# Patient Record
Sex: Female | Born: 1970 | Race: Black or African American | Hispanic: No | Marital: Married | Smoking: Never smoker
Health system: Southern US, Community
[De-identification: ages and names within clinical notes are randomized; demographics above are authoritative.]

## PROBLEM LIST (undated history)

## (undated) DIAGNOSIS — K5792 Diverticulitis of intestine, part unspecified, without perforation or abscess without bleeding: Secondary | ICD-10-CM

## (undated) DIAGNOSIS — K219 Gastro-esophageal reflux disease without esophagitis: Secondary | ICD-10-CM

## (undated) DIAGNOSIS — R51 Headache: Secondary | ICD-10-CM

## (undated) DIAGNOSIS — I82409 Acute embolism and thrombosis of unspecified deep veins of unspecified lower extremity: Secondary | ICD-10-CM

## (undated) DIAGNOSIS — I10 Essential (primary) hypertension: Secondary | ICD-10-CM

## (undated) DIAGNOSIS — I2699 Other pulmonary embolism without acute cor pulmonale: Secondary | ICD-10-CM

## (undated) HISTORY — PX: CHOLECYSTECTOMY: SHX55

## (undated) HISTORY — PX: REDUCTION MAMMAPLASTY: SUR839

## (undated) HISTORY — PX: ABDOMINAL HYSTERECTOMY: SHX81

## (undated) HISTORY — PX: BREAST SURGERY: SHX581

---

## 2012-10-22 ENCOUNTER — Ambulatory Visit (HOSPITAL_COMMUNITY)
Admission: RE | Admit: 2012-10-22 | Discharge: 2012-10-22 | Disposition: A | Discharge status: Home / Self Care | Payer: BC Managed Care – PPO | Source: Ambulatory Visit | Attending: Internal Medicine | Admitting: Internal Medicine

## 2012-10-22 ENCOUNTER — Other Ambulatory Visit (HOSPITAL_COMMUNITY): Payer: Self-pay | Admitting: Internal Medicine

## 2012-10-22 DIAGNOSIS — R109 Unspecified abdominal pain: Secondary | ICD-10-CM

## 2012-10-22 DIAGNOSIS — R1032 Left lower quadrant pain: Secondary | ICD-10-CM | POA: Insufficient documentation

## 2012-10-22 HISTORY — DX: Essential (primary) hypertension: I10

## 2012-10-22 LAB — CBC WITH DIFFERENTIAL/PLATELET
Eosinophils Absolute: 0.2 10*3/uL (ref 0.0–0.7)
Eosinophils Relative: 2 % (ref 0–5)
HCT: 39.3 % (ref 36.0–46.0)
Lymphs Abs: 3.6 10*3/uL (ref 0.7–4.0)
MCH: 28.2 pg (ref 26.0–34.0)
MCV: 81.4 fL (ref 78.0–100.0)
Monocytes Absolute: 0.4 10*3/uL (ref 0.1–1.0)
Platelets: 286 10*3/uL (ref 150–400)
RBC: 4.83 MIL/uL (ref 3.87–5.11)
RDW: 13.3 % (ref 11.5–15.5)

## 2012-10-22 LAB — COMPREHENSIVE METABOLIC PANEL
ALT: 11 U/L (ref 0–35)
Calcium: 9.2 mg/dL (ref 8.4–10.5)
Creatinine, Ser: 0.74 mg/dL (ref 0.50–1.10)
GFR calc Af Amer: >90 mL/min (ref >90)
Glucose, Bld: 94 mg/dL (ref 70–99)
Sodium: 137 mEq/L (ref 135–145)
Total Protein: 7.5 g/dL (ref 6.0–8.3)

## 2012-10-22 MED ORDER — IOHEXOL 300 MG/ML  SOLN
100.0000 mL | Freq: Once | INTRAMUSCULAR | Status: AC | PRN
  Administered 2012-10-22: 100 mL via INTRAVENOUS

## 2012-10-29 ENCOUNTER — Inpatient Hospital Stay (HOSPITAL_COMMUNITY)
Admission: AD | Admit: 2012-10-29 | Discharge: 2012-11-05 | DRG: 189 | Disposition: A | Discharge status: Home / Self Care | Payer: BC Managed Care – PPO | Source: Ambulatory Visit | Attending: Internal Medicine | Admitting: Internal Medicine

## 2012-10-29 ENCOUNTER — Encounter (HOSPITAL_COMMUNITY): Payer: Self-pay

## 2012-10-29 ENCOUNTER — Encounter (HOSPITAL_COMMUNITY): Payer: Self-pay | Admitting: General Practice

## 2012-10-29 ENCOUNTER — Inpatient Hospital Stay (HOSPITAL_COMMUNITY): Payer: BC Managed Care – PPO

## 2012-10-29 DIAGNOSIS — K5732 Diverticulitis of large intestine without perforation or abscess without bleeding: Secondary | ICD-10-CM

## 2012-10-29 DIAGNOSIS — E669 Obesity, unspecified: Secondary | ICD-10-CM | POA: Diagnosis present

## 2012-10-29 DIAGNOSIS — R109 Unspecified abdominal pain: Secondary | ICD-10-CM | POA: Diagnosis present

## 2012-10-29 DIAGNOSIS — R112 Nausea with vomiting, unspecified: Secondary | ICD-10-CM

## 2012-10-29 DIAGNOSIS — I10 Essential (primary) hypertension: Secondary | ICD-10-CM | POA: Diagnosis present

## 2012-10-29 DIAGNOSIS — K5792 Diverticulitis of intestine, part unspecified, without perforation or abscess without bleeding: Secondary | ICD-10-CM

## 2012-10-29 DIAGNOSIS — K219 Gastro-esophageal reflux disease without esophagitis: Secondary | ICD-10-CM | POA: Diagnosis present

## 2012-10-29 DIAGNOSIS — Q438 Other specified congenital malformations of intestine: Secondary | ICD-10-CM

## 2012-10-29 DIAGNOSIS — K6389 Other specified diseases of intestine: Principal | ICD-10-CM | POA: Diagnosis present

## 2012-10-29 DIAGNOSIS — Z6841 Body Mass Index (BMI) 40.0 and over, adult: Secondary | ICD-10-CM

## 2012-10-29 HISTORY — DX: Morbid (severe) obesity due to excess calories: E66.01

## 2012-10-29 HISTORY — DX: Diverticulitis of intestine, part unspecified, without perforation or abscess without bleeding: K57.92

## 2012-10-29 HISTORY — DX: Headache: R51

## 2012-10-29 HISTORY — DX: Gastro-esophageal reflux disease without esophagitis: K21.9

## 2012-10-29 LAB — CBC WITH DIFFERENTIAL/PLATELET
Basophils Absolute: 0 10*3/uL (ref 0.0–0.1)
Eosinophils Relative: 3 % (ref 0–5)
HCT: 37.7 % (ref 36.0–46.0)
Hemoglobin: 12.9 g/dL (ref 12.0–15.0)
Lymphocytes Relative: 45 % (ref 12–46)
Lymphs Abs: 3.1 10*3/uL (ref 0.7–4.0)
MCV: 81.6 fL (ref 78.0–100.0)
Monocytes Absolute: 0.4 10*3/uL (ref 0.1–1.0)
Monocytes Relative: 5 % (ref 3–12)
Neutro Abs: 3.2 10*3/uL (ref 1.7–7.7)
RBC: 4.62 MIL/uL (ref 3.87–5.11)
RDW: 13.6 % (ref 11.5–15.5)
WBC: 6.9 10*3/uL (ref 4.0–10.5)

## 2012-10-29 LAB — HEMOGLOBIN A1C
Hgb A1c MFr Bld: 6.2 % — ABNORMAL HIGH (ref <5.7)
Mean Plasma Glucose: 131 mg/dL — ABNORMAL HIGH (ref <117)

## 2012-10-29 LAB — URINALYSIS, ROUTINE W REFLEX MICROSCOPIC
Hgb urine dipstick: NEGATIVE
Nitrite: NEGATIVE
Protein, ur: NEGATIVE mg/dL
Specific Gravity, Urine: 1.012 (ref 1.005–1.030)
Urobilinogen, UA: 0.2 mg/dL (ref 0.0–1.0)

## 2012-10-29 LAB — COMPREHENSIVE METABOLIC PANEL
ALT: 13 U/L (ref 0–35)
AST: 15 U/L (ref 0–37)
Albumin: 3.4 g/dL — ABNORMAL LOW (ref 3.5–5.2)
CO2: 28 mEq/L (ref 19–32)
Calcium: 9.5 mg/dL (ref 8.4–10.5)
Creatinine, Ser: 0.75 mg/dL (ref 0.50–1.10)
GFR calc non Af Amer: >90 mL/min (ref >90)
Sodium: 141 mEq/L (ref 135–145)

## 2012-10-29 LAB — GLUCOSE, CAPILLARY: Glucose-Capillary: 114 mg/dL — ABNORMAL HIGH (ref 70–99)

## 2012-10-29 LAB — PROTIME-INR: INR: 0.94 (ref 0.00–1.49)

## 2012-10-29 LAB — PHOSPHORUS: Phosphorus: 2.8 mg/dL (ref 2.3–4.6)

## 2012-10-29 MED ORDER — CIPROFLOXACIN IN D5W 400 MG/200ML IV SOLN
400.0000 mg | Freq: Two times a day (BID) | INTRAVENOUS | Status: DC
  Administered 2012-10-29 – 2012-10-30 (×2): 400 mg via INTRAVENOUS
  Filled 2012-10-29 (×3): qty 200

## 2012-10-29 MED ORDER — METRONIDAZOLE IN NACL 5-0.79 MG/ML-% IV SOLN
500.0000 mg | Freq: Three times a day (TID) | INTRAVENOUS | Status: DC
  Administered 2012-10-29 – 2012-10-30 (×3): 500 mg via INTRAVENOUS
  Filled 2012-10-29 (×5): qty 100

## 2012-10-29 MED ORDER — HYDROMORPHONE HCL PF 1 MG/ML IJ SOLN
1.0000 mg | INTRAMUSCULAR | Status: DC | PRN
  Administered 2012-10-29 – 2012-10-31 (×10): 1 mg via INTRAVENOUS
  Filled 2012-10-29 (×10): qty 1

## 2012-10-29 MED ORDER — HYDROMORPHONE HCL PF 1 MG/ML IJ SOLN
0.5000 mg | INTRAMUSCULAR | Status: DC | PRN
  Administered 2012-10-29 (×2): 0.5 mg via INTRAVENOUS
  Filled 2012-10-29 (×2): qty 1

## 2012-10-29 MED ORDER — ACETAMINOPHEN 650 MG RE SUPP: 650.0000 mg | Freq: Four times a day (QID) | RECTAL | Status: DC | PRN

## 2012-10-29 MED ORDER — SODIUM CHLORIDE 0.9 % IV SOLN
INTRAVENOUS | Status: DC
  Administered 2012-10-29 – 2012-11-03 (×9): via INTRAVENOUS

## 2012-10-29 MED ORDER — INFLUENZA VIRUS VACC SPLIT PF IM SUSP
0.5000 mL | INTRAMUSCULAR | Status: AC
  Administered 2012-10-31: 0.5 mL via INTRAMUSCULAR
  Filled 2012-10-29: qty 0.5

## 2012-10-29 MED ORDER — PROMETHAZINE HCL 25 MG PO TABS
12.5000 mg | ORAL_TABLET | Freq: Four times a day (QID) | ORAL | Status: DC | PRN
  Administered 2012-10-29 – 2012-10-30 (×2): 12.5 mg via ORAL
  Filled 2012-10-29 (×3): qty 1

## 2012-10-29 MED ORDER — OXYCODONE HCL 5 MG PO TABS
5.0000 mg | ORAL_TABLET | ORAL | Status: DC | PRN
  Administered 2012-10-29: 5 mg via ORAL
  Filled 2012-10-29: qty 1

## 2012-10-29 MED ORDER — IOHEXOL 300 MG/ML  SOLN
20.0000 mL | INTRAMUSCULAR | Status: AC
  Administered 2012-10-29 (×2): 20 mL via ORAL

## 2012-10-29 MED ORDER — ENOXAPARIN SODIUM 40 MG/0.4ML ~~LOC~~ SOLN
40.0000 mg | SUBCUTANEOUS | Status: DC
  Administered 2012-10-29: 40 mg via SUBCUTANEOUS
  Filled 2012-10-29 (×2): qty 0.4

## 2012-10-29 MED ORDER — IOHEXOL 300 MG/ML  SOLN
100.0000 mL | Freq: Once | INTRAMUSCULAR | Status: AC | PRN
  Administered 2012-10-29: 100 mL via INTRAVENOUS

## 2012-10-29 MED ORDER — ACETAMINOPHEN 325 MG PO TABS: 650.0000 mg | ORAL_TABLET | Freq: Four times a day (QID) | ORAL | Status: DC | PRN

## 2012-10-29 NOTE — H&P (Signed)
[facility] History and Physical  Jasmine Roberson RUE:[id number] DOB: 20-Oct-1971 DOA: 10/29/2012  Referring physician: Dr Lovell Sheehan PCP: Ron Parker, MD  Specialists: none  Chief Complaint: left lower quadrant abdominal pain since 3 weeks.   HPI: Jasmine Roberson is a 41 y.o. female with prior h/o hypertension, complains of 3 weeksof LLQ abdominal pain, associated with nausea and vomiting went to PCP , a CT  Of the abdomen was ordered which showed diverticulitis and she was sent home on oral antibiotics one week ago. Her symptoms havent improved on oral antibiotic therapy. Hence she is referred for admission for iv antibiotics.  Review of Systems: The patient denies anorexia, fever, weight loss,, vision loss, decreased hearing, hoarseness, chest pain, syncope, dyspnea on exertion, peripheral edema, balance deficits, hemoptysis, , melena, hematochezia, severe indigestion/heartburn, hematuria, incontinence, genital sores, muscle weakness, suspicious skin lesions, transient blindness, difficulty walking, depression, unusual weight change, abnormal bleeding, enlarged lymph nodes, angioedema, and breast masses.    Past Medical History  Diagnosis Date  . HTN (hypertension)    No past surgical history on file. Social History:  does not have a smoking history on file. She does not have any smokeless tobacco history on file. Her alcohol and drug histories not on file.  where does patient live--home,   Can patient participate in ADLs?yes  Allergies not on file  CAD, STROKE AND DM in mother and father. (be sure to complete)  Prior to Admission medications   Not on File   Physical Exam: Filed Vitals:   10/29/12 1215  BP: 160/108  Pulse: 67  Temp: 98.1 F (36.7 C)  TempSrc: Oral  Resp: 18  SpO2: 99%    Constitutional: Vital signs reviewed.  Patient is a well-developed and well-nourished  in no acute distress and cooperative with exam. Alert and oriented x3.  Head:  Normocephalic and atraumat Mouth: no erythema or exudates, MMM Eyes: PERRL, EOMI, conjunctivae normal, No scleral icterus.  Neck: Supple, Trachea midline normal ROM, No JVD, mass, thyromegaly, or carotid bruit present.  Cardiovascular: RRR, S1 normal, S2 normal, no MRG, pulses symmetric and intact bilaterally Pulmonary/Chest: CTAB, no wheezes, rales, or rhonchi Abdominal: Soft.-tender in the LLQ, non-distended, bowel sounds are normal, no masses, organomegaly, or guarding present.  Musculoskeletal: No joint deformities, erythema, or stiffness, ROM full and no nontender Hematology: no cervical, inginal, or axillary adenopathy.  Neurological: A&O x3, Strength is normal and symmetric bilaterally, cranial nerve II-XII are grossly intact, no focal motor deficit, sensory intact to light touch bilaterally.  Skin: Warm, dry and intact. No rash, cyanosis, or clubbing.  Psychiatric: Normal mood and affect. speech and behavior is normal. Judgment and thought content normal. Cognition and memory are normal.     Labs on Admission:  Basic Metabolic Panel:  Lab 10/22/12 1478  NA 137  K 3.7  CL 100  CO2 27  GLUCOSE 94  BUN 9  CREATININE 0.74  CALCIUM 9.2  MG --  PHOS --   Liver Function Tests:  Lab 10/22/12 1550  AST 13  ALT 11  ALKPHOS 95  BILITOT 0.4  PROT 7.5  ALBUMIN 3.6   No results found for this basename: LIPASE:5,AMYLASE:5 in the last 168 hours No results found for this basename: AMMONIA:5 in the last 168 hours CBC:  Lab 10/22/12 1550  WBC 8.1  NEUTROABS 3.9  HGB 13.6  HCT 39.3  MCV 81.4  PLT 286   Cardiac Enzymes: No results found for this basename: CKTOTAL:5,CKMB:5,CKMBINDEX:5,TROPONINI:5 in the last 168 hours  BNP (last 3 results) No results found for this basename: PROBNP:3 in the last 8760 hours CBG: No results found for this basename: GLUCAP:5 in the last 168 hours  Radiological Exams on Admission: No results found.  EKG:NSR  Assessment/Plan Active  Problems:  Diverticulitis  Nausea & vomiting  Hypertension  Abdominal pain   1. DIVERTICULITIS - admit to med surg - iv antibiotics - repeat CT abd and pelvis to evaluate for abscess - clear liq diet  2. Hypertension; well controlled.not on any medications currently.   DVT prophylaxis Code Status: full code. Family Communication: none at bedside Disposition Plan:  2 days  Time spent: 52 min  Jaidee Stipe [facility] Pager [phone]  If 7PM-7AM, please contact night-coverage [email or web address] Password [email or web address] 10/29/2012, 12:57 PM

## 2012-10-29 NOTE — Progress Notes (Signed)
PENDING ADMISSION:  Call received from:    Dr. Della Goo  REASON FOR REQUESTING ADMISSION: Acute diverticulitis  HPI:   41 year old female with history of hypertension, evaluated by her primary care physician for LLQ abdominal pain and nausea/vomiting. CT scan on 10/22/2012 showed sigmoid diverticulitis, patient treated with as outpatient with oral Cipro and Flagyl, but she continues to have nausea/vomiting and LLQ abdominal pain. Patient will be admitted for further evaluation, recommends repeat CT scan on arrival.   PLAN:  According to telephone report, this patient was accepted for direct admit,  Under [facility] team:  [id number],  I have requested an order be written to call Flow Manager at [phone] upon patient arrival to the floor for final physician assignment who will do the admission and give admitting orders.  SIGNED: Clint Lipps, MD [facility]  10/29/2012, 10:49 AM

## 2012-10-29 NOTE — Progress Notes (Signed)
ADMITTED TO ROOM 6N31. GAIT STEADY, A/OX4, C/O OF TENDERNESS LLQ, DENIES NAUSEA, ORIENTED TO ROOM AND SURROUNDINGS, VISITOR AT BEDSIDE, DR. Haywood Pao NOTIFIED OF ARRIVAL.

## 2012-10-30 LAB — GLUCOSE, CAPILLARY: Glucose-Capillary: 116 mg/dL — ABNORMAL HIGH (ref 70–99)

## 2012-10-30 MED ORDER — PROMETHAZINE HCL 25 MG/ML IJ SOLN
25.0000 mg | Freq: Four times a day (QID) | INTRAMUSCULAR | Status: DC | PRN
  Administered 2012-10-30 – 2012-10-31 (×5): 25 mg via INTRAVENOUS
  Administered 2012-10-31 (×2): 12.5 mg via INTRAVENOUS
  Administered 2012-11-01: 25 mg via INTRAVENOUS
  Filled 2012-10-30 (×8): qty 1

## 2012-10-30 MED ORDER — PIPERACILLIN-TAZOBACTAM 3.375 G IVPB: 3.3750 g | Freq: Three times a day (TID) | INTRAVENOUS | Status: DC

## 2012-10-30 MED ORDER — SODIUM CHLORIDE 0.9 % IV SOLN
500.0000 mg | Freq: Four times a day (QID) | INTRAVENOUS | Status: DC
  Administered 2012-10-30 – 2012-11-05 (×24): 500 mg via INTRAVENOUS
  Filled 2012-10-30 (×28): qty 500

## 2012-10-30 MED ORDER — ENOXAPARIN SODIUM 80 MG/0.8ML ~~LOC~~ SOLN
70.0000 mg | SUBCUTANEOUS | Status: DC
  Administered 2012-10-30 – 2012-11-04 (×6): 70 mg via SUBCUTANEOUS
  Filled 2012-10-30 (×7): qty 0.8

## 2012-10-30 NOTE — Progress Notes (Signed)
[facility] PROGRESS NOTE  Jasmine Roberson ZOX:[id number] DOB: 10-31-1971 DOA: 10/29/2012 PCP: Ron Parker, MD  Assessment/Plan: Active Problems:  Diverticulitis  Nausea & vomiting  Hypertension  Abdominal pain    1. Left lower quadrant pain, CT scan shows left lower quadrant acute epiploic appendagitis, will consult surgery, continue IV antibiotics at this time 2. Hypertension stable  Code Status: full Family Communication: family updated about patient's clinical progress Disposition Plan:  As above    Brief narrative: Jasmine Roberson is a 41 y.o. female with prior h/o hypertension, complains of 3 weeksof LLQ abdominal pain, associated with nausea and vomiting went to PCP , a CT Of the abdomen was ordered which showed diverticulitis and she was sent home on oral antibiotics one week ago. Her symptoms havent improved on oral antibiotic therapy. Hence she is referred for admission for iv antibiotics.   Consultants:  None  Procedures:  None*  Antibiotics:  Ciprofloxacin and Flagyl  HPI/Subjective: Still having some abdominal pain  Objective: Filed Vitals:   10/29/12 1321 10/29/12 1426 10/29/12 2135 10/30/12 0627  BP: 139/77  144/88 102/57  Pulse: 64  74 65  Temp: 97.6 F (36.4 C)  98.1 F (36.7 C) 98.2 F (36.8 C)  TempSrc: Oral  Oral Oral  Resp: 18  18 20   Height:  5\' 8"  (1.727 m)    Weight:  145.151 kg (320 lb)    SpO2: 100%  100% 98%    Intake/Output Summary (Last 24 hours) at 10/30/12 0924 Last data filed at 10/30/12 0500  Gross per 24 hour  Intake   1235 ml  Output      0 ml  Net   1235 ml    Exam:  HENT:  Head: Atraumatic.  Nose: Nose normal.  Mouth/Throat: Oropharynx is clear and moist.  Eyes: Conjunctivae are normal. Pupils are equal, round, and reactive to light. No scleral icterus.  Neck: Neck supple. No tracheal deviation present.  Cardiovascular: Normal rate, regular rhythm, normal heart sounds and intact distal  pulses.  Pulmonary/Chest: Effort normal and breath sounds normal. No respiratory distress.  Abdominal: Soft. Normal appearance and bowel sounds are normal. She exhibits no distension. There is no tenderness.  Musculoskeletal: She exhibits no edema and no tenderness.  Neurological: She is alert. No cranial nerve deficit.    Data Reviewed: Basic Metabolic Panel:  Lab 10/29/12 8119  NA 141  K 3.8  CL 107  CO2 28  GLUCOSE 95  BUN 7  CREATININE 0.75  CALCIUM 9.5  MG 2.0  PHOS 2.8    Liver Function Tests:  Lab 10/29/12 1401  AST 15  ALT 13  ALKPHOS 92  BILITOT 0.3  PROT 7.1  ALBUMIN 3.4*   No results found for this basename: LIPASE:5,AMYLASE:5 in the last 168 hours No results found for this basename: AMMONIA:5 in the last 168 hours  CBC:  Lab 10/29/12 1401  WBC 6.9  NEUTROABS 3.2  HGB 12.9  HCT 37.7  MCV 81.6  PLT 308    Cardiac Enzymes: No results found for this basename: CKTOTAL:5,CKMB:5,CKMBINDEX:5,TROPONINI:5 in the last 168 hours BNP (last 3 results) No results found for this basename: PROBNP:3 in the last 8760 hours   CBG:  Lab 10/30/12 0734 10/29/12 1332  GLUCAP 116* 114*    No results found for this or any previous visit (from the past 240 hour(s)).   Studies: Ct Abdomen Pelvis W Contrast  10/29/2012  *RADIOLOGY REPORT*  Clinical Data: Abdominal pain left lower quadrant pain, evaluate  for abscess  CT ABDOMEN AND PELVIS WITH CONTRAST  Technique:  Multidetector CT imaging of the abdomen and pelvis was performed following the standard protocol during bolus administration of intravenous contrast.  Contrast: OMNIPAQUE IOHEXOL 300 MG/ML  SOLN  Comparison: 10/22/2012  Findings: Lung bases clear.  Minimal atelectasis.  Normal heart size.  No pericardial or pleural effusion.  Abdomen:  Prior cholecystectomy noted.  Stable right hepatic 5 mm focal hypodensity, too small to definitively characterize but suspect small hepatic cyst, image 19.  No biliary  dilatation. Hepatic and portal veins are patent.  Biliary system, pancreas, spleen, adrenal glands, and kidneys demonstrate no acute finding and are within normal limits for age.  Negative for bowel obstruction, dilatation, ileus or free air.  No abdominal free fluid, fluid collection, hemorrhage, adenopathy, or abscess.  Pelvis:  Contrast opacifies the normal appendix.  Calcified right gonadal vein phlebolith noted, image 59 which is stable.  Terminal ileum is unremarkable.  In the left lower quadrant, there is improvement in the pericolonic focal inflammatory process centered in the pericolonic fat on image 69.  Adjacent colon does not demonstrated significant wall thickening or diverticular change.  The appearance compared to the prior study is more consistent with resolving acute epiploic appendagitis rather than diverticulitis.  No associate obstruction pattern or pelvic fluid collection.  Negative for abscess.  Prior hysterectomy noted.  No pelvic free fluid, fluid collection, hemorrhage, adenopathy, inguinal abnormality, hernia.  Urinary bladder unremarkable.  No acute abnormal osseous finding.  IMPRESSION: Resolving left lower quadrant acute epiploic appendagitis. Negative for abscess.   Original Report Authenticated By: Judie Petit. Ruel Favors, M.D.    Ct Abdomen Pelvis W Contrast  10/22/2012  *RADIOLOGY REPORT*  Clinical Data: Left lower quadrant pain.  Question diverticulitis?  CT ABDOMEN AND PELVIS WITH CONTRAST  Technique:  Multidetector CT imaging of the abdomen and pelvis was performed following the standard protocol during bolus administration of intravenous contrast.  Contrast: OMNIPAQUE IOHEXOL 300 MG/ML  SOLN  Comparison: None.  Findings: Extraluminal inflammation at the junction of the descending colon and sigmoid colon most consistent with changes of diverticulitis without drainable abscess or free intraperitoneal air.  Minimal basilar atelectasis.  Cardiomegaly.  No abdominal aortic aneurysm.   Post cholecystectomy.  Focal fatty infiltration within the liver adjacent the falciform ligament.  Right lobe of liver 5 mm low density lesion too small to be characterized as a simple cyst.  No focal splenic, pancreatic, renal or adrenal lesion.  Appearance of prior hysterectomy. Right ovary larger than the left containing small cysts/follicles.  Noncontrast filled views of the urinary bladder unremarkable.  No bony destructive lesion.  Scattered normal sized lymph nodes without adenopathy.  Mild diastasis rectus muscle without discrete bowel containing hernia.  IMPRESSION: Extraluminal inflammation at the junction of the descending colon and sigmoid colon most consistent with changes of diverticulitis without drainable abscess or free intraperitoneal air.  Please see above.  This has been made a PRA call report utilizing dashboard call feature.   Original Report Authenticated By: Fuller Canada, M.D.     Scheduled Meds:   . ciprofloxacin  400 mg Intravenous Q12H  . enoxaparin (LOVENOX) injection  40 mg Subcutaneous Q24H  . influenza  inactive virus vaccine  0.5 mL Intramuscular Tomorrow-1000  . iohexol  20 mL Oral Q1 Hr x 2  . metronidazole  500 mg Intravenous Q8H   Continuous Infusions:   . sodium chloride 50 mL/hr at 10/29/12 1356    Active  Problems:  Diverticulitis  Nausea & vomiting  Hypertension  Abdominal pain    Time spent: 40 minutes   [facility]  [facility] Pager [phone]. If 8PM-8AM, please contact night-coverage at [email or web address], password [facility] 10/30/2012, 9:24 AM  LOS: 1 day

## 2012-10-30 NOTE — Progress Notes (Signed)
ANTIBIOTIC CONSULT NOTE - INITIAL  Pharmacy Consult for Primaxin Indication: diverticulitis  Allergies  Allergen Reactions  . Morphine And Related   . Penicillins     Hives and lip swelling  . Zofran (Ondansetron Hcl)     Patient Measurements: Height: 5\' 8"  (172.7 cm) Weight: 320 lb (145.151 kg) IBW/kg (Calculated) : 63.9   Vital Signs: Temp: 98.2 F (36.8 C) (10/31 0627) Temp src: Oral (10/31 0627) BP: 102/57 mmHg (10/31 0627) Pulse Rate: 65  (10/31 0627) Intake/Output from previous day: 10/30 0701 - 10/31 0700 In: 1235 [P.O.:195; I.V.:740; IV Piggyback:300] Out: -  Intake/Output from this shift:    Labs:  Basename 10/29/12 1401  WBC 6.9  HGB 12.9  PLT 308  LABCREA --  CREATININE 0.75   Estimated Creatinine Clearance: 139.4 ml/min (by C-G formula based on Cr of 0.75). No results found for this basename: VANCOTROUGH:2,VANCOPEAK:2,VANCORANDOM:2,GENTTROUGH:2,GENTPEAK:2,GENTRANDOM:2,TOBRATROUGH:2,TOBRAPEAK:2,TOBRARND:2,AMIKACINPEAK:2,AMIKACINTROU:2,AMIKACIN:2, in the last 72 hours   Microbiology: No results found for this or any previous visit (from the past 720 hour(s)).  Medical History: Past Medical History  Diagnosis Date  . HTN (hypertension)   . Diverticulitis   . GERD (gastroesophageal reflux disease)   . Headache     Medications:  Prescriptions prior to admission  Medication Sig Dispense Refill  . amLODipine (NORVASC) 5 MG tablet Take 5 mg by mouth daily.      Marland Kitchen esomeprazole (NEXIUM) 40 MG capsule Take 40 mg by mouth daily before breakfast.      . traZODone (DESYREL) 50 MG tablet Take 50 mg by mouth at bedtime as needed.      . valsartan-hydrochlorothiazide (DIOVAN-HCT) 320-25 MG per tablet Take 1 tablet by mouth daily.       Assessment: Patient with a 3-wk hx of abd pain and CT evidence of diverticulitis did not respond to PO abx, and is admitted for IV antibiotics. Noted WBC is nml and pt is afebrile, and re-CT abd was negative for abscess  formation, but positive for general inflammation. Noted plans for surgical consult. Received orders to start Primaxin empirically (changed from Zosyn d/t PCN allx).  Goal of Therapy:  Limit broad spectrum abx course, treat infection  Plan:  - Primaxin 500mg  IV q 6h, start now - Considering patient has mild-moderate disease, if surgery assessment is benign, consider decreasing abx spectrum by using Cipro again or ertapenem. - Given BMI is 48kg/m2, will change DVT prophylaxis Lovenox dose to 70mg  q 24h. - Will f/up along with you daily.  Thanks, Jaran Sainz K. Allena Katz, PharmD, BCPS.  Clinical Pharmacist Pager [phone]. 10/30/2012 10:50 AM

## 2012-10-31 ENCOUNTER — Encounter (HOSPITAL_COMMUNITY): Payer: Self-pay | Admitting: General Surgery

## 2012-10-31 DIAGNOSIS — Q438 Other specified congenital malformations of intestine: Secondary | ICD-10-CM

## 2012-10-31 LAB — CBC
MCH: 27.7 pg (ref 26.0–34.0)
MCHC: 33.2 g/dL (ref 30.0–36.0)
MCV: 83.3 fL (ref 78.0–100.0)
Platelets: 284 10*3/uL (ref 150–400)
RDW: 13.6 % (ref 11.5–15.5)

## 2012-10-31 LAB — GLUCOSE, CAPILLARY: Glucose-Capillary: 105 mg/dL — ABNORMAL HIGH (ref 70–99)

## 2012-10-31 MED ORDER — HYDROMORPHONE HCL PF 1 MG/ML IJ SOLN
INTRAMUSCULAR | Status: AC
  Filled 2012-10-31: qty 1

## 2012-10-31 MED ORDER — KETOROLAC TROMETHAMINE 30 MG/ML IJ SOLN
30.0000 mg | Freq: Three times a day (TID) | INTRAMUSCULAR | Status: DC
  Administered 2012-10-31 – 2012-11-01 (×2): 30 mg via INTRAVENOUS
  Filled 2012-10-31 (×6): qty 1

## 2012-10-31 MED ORDER — HYDROMORPHONE HCL PF 1 MG/ML IJ SOLN
2.0000 mg | INTRAMUSCULAR | Status: DC | PRN
  Administered 2012-10-31: 2 mg via INTRAVENOUS
  Administered 2012-10-31: 1 mg via INTRAVENOUS
  Administered 2012-10-31 – 2012-11-01 (×5): 2 mg via INTRAVENOUS
  Administered 2012-11-01: 1 mg via INTRAVENOUS
  Administered 2012-11-01: 2 mg via INTRAVENOUS
  Administered 2012-11-01 (×3): 1 mg via INTRAVENOUS
  Administered 2012-11-01 – 2012-11-02 (×4): 2 mg via INTRAVENOUS
  Filled 2012-10-31 (×3): qty 2
  Filled 2012-10-31: qty 1
  Filled 2012-10-31 (×2): qty 2
  Filled 2012-10-31: qty 1
  Filled 2012-10-31: qty 2
  Filled 2012-10-31: qty 1
  Filled 2012-10-31 (×5): qty 2
  Filled 2012-10-31: qty 1

## 2012-10-31 NOTE — Consult Note (Signed)
Agree with PA-Osborne's consult epicloic appendigitis per CT scan Pt with no infection count.  Would recommend at minimum iv abx and npo. No surgical interventions needed. Will follow abd exams

## 2012-10-31 NOTE — Progress Notes (Signed)
ANTIBIOTIC CONSULT NOTE - FOLLOW UP  Pharmacy Consult for Primaxin Indication: diverticulitis  Allergies  Allergen Reactions  . Morphine And Related   . Penicillins     Hives and lip swelling  . Zofran (Ondansetron Hcl)     Patient Measurements: Height: 5\' 8"  (172.7 cm) Weight: 320 lb (145.151 kg) IBW/kg (Calculated) : 63.9   Vital Signs: Temp: 98.6 F (37 C) (11/01 0518) Temp src: Oral (11/01 0518) BP: 140/86 mmHg (11/01 0518) Pulse Rate: 75  (11/01 0518) Intake/Output from previous day: 10/31 0701 - 11/01 0700 In: 3122 [P.O.:360; I.V.:2662; IV Piggyback:100] Out: 1300 [Urine:1300] Intake/Output from this shift:    Labs:  Basename 10/29/12 1401  WBC 6.9  HGB 12.9  PLT 308  LABCREA --  CREATININE 0.75   Estimated Creatinine Clearance: 139.4 ml/min (by C-G formula based on Cr of 0.75). No results found for this basename: VANCOTROUGH:2,VANCOPEAK:2,VANCORANDOM:2,GENTTROUGH:2,GENTPEAK:2,GENTRANDOM:2,TOBRATROUGH:2,TOBRAPEAK:2,TOBRARND:2,AMIKACINPEAK:2,AMIKACINTROU:2,AMIKACIN:2, in the last 72 hours   Microbiology: No results found for this or any previous visit (from the past 720 hour(s)).  Anti-infectives     Start     Dose/Rate Route Frequency Ordered Stop   10/30/12 1100   imipenem-cilastatin (PRIMAXIN) 500 mg in sodium chloride 0.9 % 100 mL IVPB        500 mg 200 mL/hr over 30 Minutes Intravenous 4 times per day 10/30/12 1036     10/30/12 1030   piperacillin-tazobactam (ZOSYN) IVPB 3.375 g  Status:  Discontinued        3.375 g 12.5 mL/hr over 240 Minutes Intravenous 3 times per day 10/30/12 1018 10/30/12 1032   10/29/12 1500   ciprofloxacin (CIPRO) IVPB 400 mg  Status:  Discontinued        400 mg 200 mL/hr over 60 Minutes Intravenous Every 12 hours 10/29/12 1445 10/30/12 1017   10/29/12 1500   metroNIDAZOLE (FLAGYL) IVPB 500 mg  Status:  Discontinued        500 mg 100 mL/hr over 60 Minutes Intravenous Every 8 hours 10/29/12 1445 10/30/12 1017           Assessment: Diverticulitis:  On Day #2 of empiric therapy with Primaxin, broadened 10/31 from Cipro/Flagyl.  She remains afebrile.    Goal of Therapy:  Eradication of infection  Plan:  Continue Primaxin 500mg  IV q6h Monitor renal function and clinical condition.  Estella Husk, Pharm.D., BCPS Clinical Pharmacist  Phone [phone] Pager [phone] 10/31/2012, 12:32 PM

## 2012-10-31 NOTE — Consult Note (Signed)
Jasmine Roberson [birth year]  [id number].   Primary Care MD: Dr. Della Goo Requesting MD: Dr. Susie Cassette Chief Complaint/Reason for Consult: epiploic appendagitis HPI: This is a 41 yo female who developed periumbilical abdominal pain around 3 weeks again.  It began radiating to her LLQ.  She initially had no nausea or vomiting.  She does admit to some diarrhea.  She was evaluated and sent for a CT scan that questioned diverticulitis.  She was started as an outpatient on Cipro and Flagyl.  She developed nausea and vomiting secondary to her antibiotics.  She went back and saw her PCP who direct admitted her for further care.  She denies any fevers or chills.  She has had a CT scan during this admission which reveals epiploic appendagitis with improved stranding noted compared to prior CT scan.  Due to continued pain, we have been asked to see for further recommendations.  Review of Systems: Please see HPI otherwise all other systems have been reviewed and are negative.  History reviewed. No pertinent family history.  Past Medical History  Diagnosis Date  . HTN (hypertension)   . Diverticulitis   . GERD (gastroesophageal reflux disease)   . Headache     Past Surgical History  Procedure Date  . Cesarean section   . Cholecystectomy   . Abdominal hysterectomy     partial  . Breast surgery     reduction    Social History:  reports that she has never smoked. She has never used smokeless tobacco. She reports that she does not drink alcohol or use illicit drugs.  Allergies:  Allergies  Allergen Reactions  . Morphine And Related   . Penicillins     Hives and lip swelling  . Zofran (Ondansetron Hcl)     Medications Prior to Admission  Medication Sig Dispense Refill  . amLODipine (NORVASC) 5 MG tablet Take 5 mg by mouth daily.      Marland Kitchen esomeprazole (NEXIUM) 40 MG capsule Take 40 mg by mouth daily before breakfast.      . traZODone (DESYREL) 50 MG tablet Take 50 mg by mouth at  bedtime as needed.      . valsartan-hydrochlorothiazide (DIOVAN-HCT) 320-25 MG per tablet Take 1 tablet by mouth daily.        Blood pressure 140/86, pulse 75, temperature 98.6 F (37 C), temperature source Oral, resp. rate 18, height 5\' 8"  (1.727 m), weight 320 lb (145.151 kg), SpO2 100.00%. Physical Exam: General: pleasant, obese black female who is laying in bed in NAD HEENT: head is normocephalic, atraumatic.  Sclera are noninjected.  PERRL.  Ears and nose without any masses or lesions.  Mouth is pink and moist Heart: regular, rate, and rhythm.  Normal s1,s2. No obvious murmurs, gallops, or rubs noted.  Palpable radial and pedal pulses bilaterally Lungs: CTAB, no wheezes, rhonchi, or rales noted.  Respiratory effort nonlabored Abd: soft, tender on the left side of abdomen, ND, +BS, obese, no guarding, rebounding, or peritoneal signs, no masses, hernias, or organomegaly MS: all 4 extremities are symmetrical with no cyanosis, clubbing, or edema. Skin: warm and dry with no masses, lesions, or rashes Psych: A&Ox3 with an appropriate affect.    Results for orders placed during the hospital encounter of 10/29/12 (from the past 48 hour(s))  GLUCOSE, CAPILLARY     Status: Abnormal   Collection Time   10/29/12  1:32 PM      Component Value Range Comment   Glucose-Capillary 114 (*) 70 - 99 mg/dL  COMPREHENSIVE METABOLIC PANEL     Status: Abnormal   Collection Time   10/29/12  2:01 PM      Component Value Range Comment   Sodium 141  135 - 145 mEq/L    Potassium 3.8  3.5 - 5.1 mEq/L    Chloride 107  96 - 112 mEq/L    CO2 28  19 - 32 mEq/L    Glucose, Bld 95  70 - 99 mg/dL    BUN 7  6 - 23 mg/dL    Creatinine, Ser 4.54  0.50 - 1.10 mg/dL    Calcium 9.5  8.4 - 09.8 mg/dL    Total Protein 7.1  6.0 - 8.3 g/dL    Albumin 3.4 (*) 3.5 - 5.2 g/dL    AST 15  0 - 37 U/L    ALT 13  0 - 35 U/L    Alkaline Phosphatase 92  39 - 117 U/L    Total Bilirubin 0.3  0.3 - 1.2 mg/dL    GFR calc non Af  Amer >90  >90 mL/min    GFR calc Af Amer >90  >90 mL/min   MAGNESIUM     Status: Normal   Collection Time   10/29/12  2:01 PM      Component Value Range Comment   Magnesium 2.0  1.5 - 2.5 mg/dL   PHOSPHORUS     Status: Normal   Collection Time   10/29/12  2:01 PM      Component Value Range Comment   Phosphorus 2.8  2.3 - 4.6 mg/dL   CBC WITH DIFFERENTIAL     Status: Normal   Collection Time   10/29/12  2:01 PM      Component Value Range Comment   WBC 6.9  4.0 - 10.5 K/uL    RBC 4.62  3.87 - 5.11 MIL/uL    Hemoglobin 12.9  12.0 - 15.0 g/dL    HCT 11.9  14.7 - 82.9 %    MCV 81.6  78.0 - 100.0 fL    MCH 27.9  26.0 - 34.0 pg    MCHC 34.2  30.0 - 36.0 g/dL    RDW 56.2  13.0 - 86.5 %    Platelets 308  150 - 400 K/uL    Neutrophils Relative 47  43 - 77 %    Neutro Abs 3.2  1.7 - 7.7 K/uL    Lymphocytes Relative 45  12 - 46 %    Lymphs Abs 3.1  0.7 - 4.0 K/uL    Monocytes Relative 5  3 - 12 %    Monocytes Absolute 0.4  0.1 - 1.0 K/uL    Eosinophils Relative 3  0 - 5 %    Eosinophils Absolute 0.2  0.0 - 0.7 K/uL    Basophils Relative 0  0 - 1 %    Basophils Absolute 0.0  0.0 - 0.1 K/uL   TSH     Status: Normal   Collection Time   10/29/12  2:01 PM      Component Value Range Comment   TSH 3.833  0.350 - 4.500 uIU/mL   PROTIME-INR     Status: Normal   Collection Time   10/29/12  2:01 PM      Component Value Range Comment   Prothrombin Time 12.5  11.6 - 15.2 seconds    INR 0.94  0.00 - 1.49   HEMOGLOBIN A1C     Status: Abnormal   Collection Time   10/29/12  2:01 PM      Component Value Range Comment   Hemoglobin A1C 6.2 (*) <5.7 %    Mean Plasma Glucose 131 (*) <117 mg/dL   URINALYSIS, ROUTINE W REFLEX MICROSCOPIC     Status: Normal   Collection Time   10/29/12  3:14 PM      Component Value Range Comment   Color, Urine YELLOW  YELLOW    APPearance CLEAR  CLEAR    Specific Gravity, Urine 1.012  1.005 - 1.030    pH 7.5  5.0 - 8.0    Glucose, UA NEGATIVE  NEGATIVE mg/dL      Hgb urine dipstick NEGATIVE  NEGATIVE    Bilirubin Urine NEGATIVE  NEGATIVE    Ketones, ur NEGATIVE  NEGATIVE mg/dL    Protein, ur NEGATIVE  NEGATIVE mg/dL    Urobilinogen, UA 0.2  0.0 - 1.0 mg/dL    Nitrite NEGATIVE  NEGATIVE    Leukocytes, UA NEGATIVE  NEGATIVE MICROSCOPIC NOT DONE ON URINES WITH NEGATIVE PROTEIN, BLOOD, LEUKOCYTES, NITRITE, OR GLUCOSE <1000 mg/dL.  GLUCOSE, CAPILLARY     Status: Abnormal   Collection Time   10/30/12  7:34 AM      Component Value Range Comment   Glucose-Capillary 116 (*) 70 - 99 mg/dL   GLUCOSE, CAPILLARY     Status: Abnormal   Collection Time   10/31/12  8:22 AM      Component Value Range Comment   Glucose-Capillary 105 (*) 70 - 99 mg/dL    Comment 1 Documented in Chart      Comment 2 Notify RN      Ct Abdomen Pelvis W Contrast  10/29/2012  *RADIOLOGY REPORT*  Clinical Data: Abdominal pain left lower quadrant pain, evaluate for abscess  CT ABDOMEN AND PELVIS WITH CONTRAST  Technique:  Multidetector CT imaging of the abdomen and pelvis was performed following the standard protocol during bolus administration of intravenous contrast.  Contrast: OMNIPAQUE IOHEXOL 300 MG/ML  SOLN  Comparison: 10/22/2012  Findings: Lung bases clear.  Minimal atelectasis.  Normal heart size.  No pericardial or pleural effusion.  Abdomen:  Prior cholecystectomy noted.  Stable right hepatic 5 mm focal hypodensity, too small to definitively characterize but suspect small hepatic cyst, image 19.  No biliary dilatation. Hepatic and portal veins are patent.  Biliary system, pancreas, spleen, adrenal glands, and kidneys demonstrate no acute finding and are within normal limits for age.  Negative for bowel obstruction, dilatation, ileus or free air.  No abdominal free fluid, fluid collection, hemorrhage, adenopathy, or abscess.  Pelvis:  Contrast opacifies the normal appendix.  Calcified right gonadal vein phlebolith noted, image 59 which is stable.  Terminal ileum is  unremarkable.  In the left lower quadrant, there is improvement in the pericolonic focal inflammatory process centered in the pericolonic fat on image 69.  Adjacent colon does not demonstrated significant wall thickening or diverticular change.  The appearance compared to the prior study is more consistent with resolving acute epiploic appendagitis rather than diverticulitis.  No associate obstruction pattern or pelvic fluid collection.  Negative for abscess.  Prior hysterectomy noted.  No pelvic free fluid, fluid collection, hemorrhage, adenopathy, inguinal abnormality, hernia.  Urinary bladder unremarkable.  No acute abnormal osseous finding.  IMPRESSION: Resolving left lower quadrant acute epiploic appendagitis. Negative for abscess.   Original Report Authenticated By: Judie Petit. Ruel Favors, M.D.        Assessment/Plan 1. Epiploic appendagitis 2. HTN  Plan: 1. Treatment for this condition is  mostly symptomatic.  No antibiotics are indicated.  This is usually self-limiting.  Treatment with anti-inflammatories and narcotics as needed to control pain are appropriate.  No surgical indications.  The CT scan does show improvement in the amount of stranding the patient has, so it appears that the inflammation is improving.  Would recommend advancing diet as able and symptomatic treatment.  Thank you for this consultation.  Christofer Shen E 10/31/2012, 9:50 AM Pager: [phone]

## 2012-10-31 NOTE — Progress Notes (Signed)
[facility] PROGRESS NOTE  Jasmine Roberson ZOX:[id number] DOB: 1971/05/30 DOA: 10/29/2012 PCP: Ron Parker, MD  Assessment/Plan: Active Problems:  Diverticulitis  Nausea & vomiting  Hypertension  Abdominal pain  1. Left lower quadrant pain, CT scan shows left lower quadrant acute epiploic appendagitis, will consult surgery given unrelenting pain,, continue IV antibiotics at this time 2. Hypertension stable Code Status: full  Family Communication: family updated about patient's clinical progress  Disposition Plan: As above  Brief narrative:  Jasmine Roberson is a 41 y.o. female with prior h/o hypertension, complains of 3 weeksof LLQ abdominal pain, associated with nausea and vomiting went to PCP , a CT Of the abdomen was ordered which showed diverticulitis and she was sent home on oral antibiotics one week ago. Her symptoms havent improved on oral antibiotic therapy. Hence she is referred for admission for iv antibiotics.  Consultants:  None Procedures:  None* Antibiotics:  Ciprofloxacin and Flagyl HPI/Subjective:  Still complaining of a lot of abdominal pain  Objective: Filed Vitals:   10/30/12 0627 10/30/12 1405 10/30/12 2125 10/31/12 0518  BP: 102/57 102/57 133/68 140/86  Pulse: 65 66 84 75  Temp: 98.2 F (36.8 C) 98 F (36.7 C) 98.2 F (36.8 C) 98.6 F (37 C)  TempSrc: Oral Oral Oral Oral  Resp: 20 20 18 18   Height:      Weight:      SpO2: 98% 99% 100% 100%    Intake/Output Summary (Last 24 hours) at 10/31/12 0940 Last data filed at 10/31/12 0512  Gross per 24 hour  Intake   2882 ml  Output   1300 ml  Net   1582 ml    Exam:  HENT:  Head: Atraumatic.  Nose: Nose normal.  Mouth/Throat: Oropharynx is clear and moist.  Eyes: Conjunctivae are normal. Pupils are equal, round, and reactive to light. No scleral icterus.  Neck: Neck supple. No tracheal deviation present.  Cardiovascular: Normal rate, regular rhythm, normal heart sounds and  intact distal pulses.  Pulmonary/Chest: Effort normal and breath sounds normal. No respiratory distress.  Abdominal: Soft. Normal appearance and bowel sounds are normal. She exhibits no distension. There is no tenderness.  Musculoskeletal: She exhibits no edema and no tenderness.  Neurological: She is alert. No cranial nerve deficit.    Data Reviewed: Basic Metabolic Panel:  Lab 10/29/12 8119  NA 141  K 3.8  CL 107  CO2 28  GLUCOSE 95  BUN 7  CREATININE 0.75  CALCIUM 9.5  MG 2.0  PHOS 2.8    Liver Function Tests:  Lab 10/29/12 1401  AST 15  ALT 13  ALKPHOS 92  BILITOT 0.3  PROT 7.1  ALBUMIN 3.4*   No results found for this basename: LIPASE:5,AMYLASE:5 in the last 168 hours No results found for this basename: AMMONIA:5 in the last 168 hours  CBC:  Lab 10/29/12 1401  WBC 6.9  NEUTROABS 3.2  HGB 12.9  HCT 37.7  MCV 81.6  PLT 308    Cardiac Enzymes: No results found for this basename: CKTOTAL:5,CKMB:5,CKMBINDEX:5,TROPONINI:5 in the last 168 hours BNP (last 3 results) No results found for this basename: PROBNP:3 in the last 8760 hours   CBG:  Lab 10/31/12 0822 10/30/12 0734 10/29/12 1332  GLUCAP 105* 116* 114*    No results found for this or any previous visit (from the past 240 hour(s)).   Studies: Ct Abdomen Pelvis W Contrast  10/29/2012  *RADIOLOGY REPORT*  Clinical Data: Abdominal pain left lower quadrant pain, evaluate for abscess  CT ABDOMEN AND PELVIS WITH CONTRAST  Technique:  Multidetector CT imaging of the abdomen and pelvis was performed following the standard protocol during bolus administration of intravenous contrast.  Contrast: OMNIPAQUE IOHEXOL 300 MG/ML  SOLN  Comparison: 10/22/2012  Findings: Lung bases clear.  Minimal atelectasis.  Normal heart size.  No pericardial or pleural effusion.  Abdomen:  Prior cholecystectomy noted.  Stable right hepatic 5 mm focal hypodensity, too small to definitively characterize but suspect small  hepatic cyst, image 19.  No biliary dilatation. Hepatic and portal veins are patent.  Biliary system, pancreas, spleen, adrenal glands, and kidneys demonstrate no acute finding and are within normal limits for age.  Negative for bowel obstruction, dilatation, ileus or free air.  No abdominal free fluid, fluid collection, hemorrhage, adenopathy, or abscess.  Pelvis:  Contrast opacifies the normal appendix.  Calcified right gonadal vein phlebolith noted, image 59 which is stable.  Terminal ileum is unremarkable.  In the left lower quadrant, there is improvement in the pericolonic focal inflammatory process centered in the pericolonic fat on image 69.  Adjacent colon does not demonstrated significant wall thickening or diverticular change.  The appearance compared to the prior study is more consistent with resolving acute epiploic appendagitis rather than diverticulitis.  No associate obstruction pattern or pelvic fluid collection.  Negative for abscess.  Prior hysterectomy noted.  No pelvic free fluid, fluid collection, hemorrhage, adenopathy, inguinal abnormality, hernia.  Urinary bladder unremarkable.  No acute abnormal osseous finding.  IMPRESSION: Resolving left lower quadrant acute epiploic appendagitis. Negative for abscess.   Original Report Authenticated By: Judie Petit. Ruel Favors, M.D.    Ct Abdomen Pelvis W Contrast  10/22/2012  *RADIOLOGY REPORT*  Clinical Data: Left lower quadrant pain.  Question diverticulitis?  CT ABDOMEN AND PELVIS WITH CONTRAST  Technique:  Multidetector CT imaging of the abdomen and pelvis was performed following the standard protocol during bolus administration of intravenous contrast.  Contrast: OMNIPAQUE IOHEXOL 300 MG/ML  SOLN  Comparison: None.  Findings: Extraluminal inflammation at the junction of the descending colon and sigmoid colon most consistent with changes of diverticulitis without drainable abscess or free intraperitoneal air.  Minimal basilar atelectasis.   Cardiomegaly.  No abdominal aortic aneurysm.  Post cholecystectomy.  Focal fatty infiltration within the liver adjacent the falciform ligament.  Right lobe of liver 5 mm low density lesion too small to be characterized as a simple cyst.  No focal splenic, pancreatic, renal or adrenal lesion.  Appearance of prior hysterectomy. Right ovary larger than the left containing small cysts/follicles.  Noncontrast filled views of the urinary bladder unremarkable.  No bony destructive lesion.  Scattered normal sized lymph nodes without adenopathy.  Mild diastasis rectus muscle without discrete bowel containing hernia.  IMPRESSION: Extraluminal inflammation at the junction of the descending colon and sigmoid colon most consistent with changes of diverticulitis without drainable abscess or free intraperitoneal air.  Please see above.  This has been made a PRA call report utilizing dashboard call feature.   Original Report Authenticated By: Fuller Canada, M.D.     Scheduled Meds:   . enoxaparin (LOVENOX) injection  70 mg Subcutaneous Q24H  . HYDROmorphone      . imipenem-cilastatin  500 mg Intravenous Q6H  . influenza  inactive virus vaccine  0.5 mL Intramuscular Tomorrow-1000  . DISCONTD: ciprofloxacin  400 mg Intravenous Q12H  . DISCONTD: enoxaparin (LOVENOX) injection  40 mg Subcutaneous Q24H  . DISCONTD: metronidazole  500 mg Intravenous Q8H  . DISCONTD: piperacillin-tazobactam (  ZOSYN)  IV  3.375 g Intravenous Q8H   Continuous Infusions:   . sodium chloride 125 mL/hr at 10/31/12 4782    Active Problems:  Diverticulitis  Nausea & vomiting  Hypertension  Abdominal pain    Time spent: 40 minutes   [facility]  [facility] Pager [phone]. If 8PM-8AM, please contact night-coverage at [email or web address], password [facility] 10/31/2012, 9:40 AM  LOS: 2 days

## 2012-11-01 ENCOUNTER — Encounter (HOSPITAL_COMMUNITY): Payer: Self-pay | Admitting: Surgery

## 2012-11-01 DIAGNOSIS — E66813 Obesity, class 3: Secondary | ICD-10-CM

## 2012-11-01 DIAGNOSIS — K6389 Other specified diseases of intestine: Secondary | ICD-10-CM | POA: Diagnosis present

## 2012-11-01 HISTORY — DX: Morbid (severe) obesity due to excess calories: E66.01

## 2012-11-01 HISTORY — DX: Obesity, class 3: E66.813

## 2012-11-01 LAB — CBC
MCHC: 32.4 g/dL (ref 30.0–36.0)
Platelets: 299 10*3/uL (ref 150–400)
RDW: 13.8 % (ref 11.5–15.5)
WBC: 6.9 10*3/uL (ref 4.0–10.5)

## 2012-11-01 LAB — COMPREHENSIVE METABOLIC PANEL
AST: 20 U/L (ref 0–37)
Albumin: 3 g/dL — ABNORMAL LOW (ref 3.5–5.2)
Alkaline Phosphatase: 73 U/L (ref 39–117)
Chloride: 108 mEq/L (ref 96–112)
Creatinine, Ser: 0.85 mg/dL (ref 0.50–1.10)
Potassium: 3.6 mEq/L (ref 3.5–5.1)
Total Bilirubin: 0.4 mg/dL (ref 0.3–1.2)
Total Protein: 6.5 g/dL (ref 6.0–8.3)

## 2012-11-01 LAB — GLUCOSE, CAPILLARY

## 2012-11-01 MED ORDER — PSYLLIUM 95 % PO PACK
1.0000 | PACK | Freq: Two times a day (BID) | ORAL | Status: DC
  Administered 2012-11-01 – 2012-11-02 (×4): 1 via ORAL
  Filled 2012-11-01 (×10): qty 1

## 2012-11-01 MED ORDER — BLISTEX EX OINT
TOPICAL_OINTMENT | Freq: Two times a day (BID) | CUTANEOUS | Status: DC
  Administered 2012-11-01 – 2012-11-05 (×6): via TOPICAL
  Filled 2012-11-01: qty 10

## 2012-11-01 MED ORDER — NAPROXEN 500 MG PO TABS
500.0000 mg | ORAL_TABLET | Freq: Three times a day (TID) | ORAL | Status: DC
  Administered 2012-11-01 – 2012-11-02 (×4): 500 mg via ORAL
  Filled 2012-11-01 (×7): qty 1

## 2012-11-01 MED ORDER — DIPHENHYDRAMINE HCL 50 MG/ML IJ SOLN: 12.5000 mg | Freq: Four times a day (QID) | INTRAMUSCULAR | Status: DC | PRN

## 2012-11-01 MED ORDER — ALUM & MAG HYDROXIDE-SIMETH 200-200-20 MG/5ML PO SUSP: 30.0000 mL | Freq: Four times a day (QID) | ORAL | Status: DC | PRN

## 2012-11-01 MED ORDER — PROMETHAZINE HCL 25 MG/ML IJ SOLN
12.5000 mg | Freq: Four times a day (QID) | INTRAMUSCULAR | Status: DC | PRN
  Administered 2012-11-01 – 2012-11-02 (×4): 25 mg via INTRAVENOUS
  Filled 2012-11-01 (×4): qty 1

## 2012-11-01 MED ORDER — MAGIC MOUTHWASH
15.0000 mL | Freq: Four times a day (QID) | ORAL | Status: DC | PRN
  Filled 2012-11-01: qty 15

## 2012-11-01 MED ORDER — LIP MEDEX EX OINT
1.0000 "application " | TOPICAL_OINTMENT | Freq: Two times a day (BID) | CUTANEOUS | Status: DC
  Filled 2012-11-01: qty 7

## 2012-11-01 MED ORDER — OXYCODONE HCL 5 MG PO TABS: 5.0000 mg | ORAL_TABLET | ORAL | Status: DC | PRN

## 2012-11-01 MED ORDER — MAGNESIUM HYDROXIDE 400 MG/5ML PO SUSP: 30.0000 mL | Freq: Two times a day (BID) | ORAL | Status: DC | PRN

## 2012-11-01 MED ORDER — WHITE PETROLATUM GEL
Status: AC
  Administered 2012-11-01: 0.2
  Filled 2012-11-01: qty 5

## 2012-11-01 NOTE — Progress Notes (Signed)
[facility] PROGRESS NOTE  Atianna Haidar GNF:[id number] DOB: October 14, 1971 DOA: 10/29/2012 PCP: Ron Parker, MD  Assessment/Plan: Principal Problem:  *Epiploic appendagitis Active Problems:  Nausea & vomiting  Hypertension  Abdominal pain  Obesity, Class III, BMI 40-49.9 (morbid obesity)     left lower quadrant acute epiploic appendagitis  Try inc PO  Bowel regimen  Cont IV ABx given persistent Sx for many weeks  Try change IV to Po meds when able Repeat CT scan in 3-4 days if not better  Mobilize - get her up more!  -VTE prophylaxis- SCDs, etc  -mobilize as tolerated to help recovery   Code Status: full Family Communication: family updated about patient's clinical progress Disposition Plan:  As above    Brief narrative: This is a 41 yo female who developed periumbilical abdominal pain around 3 weeks again. It began radiating to her LLQ. She initially had no nausea or vomiting. She does admit to some diarrhea. She was evaluated and sent for a CT scan that questioned diverticulitis. She was started as an outpatient on Cipro and Flagyl. She developed nausea and vomiting secondary to her antibiotics. She went back and saw her PCP who direct admitted her for further care. She denies any fevers or chills. She has had a CT scan during this admission which reveals epiploic appendagitis with improved stranding noted compared to prior CT scan. Due to continued pain, we have been asked to see for further recommendations.   Consultants:  General surgery   None  Antibiotics:  Imipenem  HPI/Subjective: Pt c/o moderate pain/soreness. RLQ>periumb>>periumb  N/V x3 yest. Sips now   Objective: Filed Vitals:   10/31/12 0518 10/31/12 1359 10/31/12 2136 11/01/12 0615  BP: 140/86 134/66 136/75 137/67  Pulse: 75 64 65 86  Temp: 98.6 F (37 C) 98.4 F (36.9 C) 98.2 F (36.8 C) 98.5 F (36.9 C)  TempSrc: Oral Oral Oral Oral  Resp: 18 19 18 18   Height:        Weight:      SpO2: 100% 100% 95% 100%    Intake/Output Summary (Last 24 hours) at 11/01/12 8469 Last data filed at 11/01/12 0600  Gross per 24 hour  Intake   2245 ml  Output   2200 ml  Net     45 ml    Exam:  General: pleasant, obese black female who is laying in bed in NAD  HEENT: head is normocephalic, atraumatic. Sclera are noninjected. PERRL. Ears and nose without any masses or lesions. Mouth is pink and moist  Heart: regular, rate, and rhythm. Normal s1,s2. No obvious murmurs, gallops, or rubs noted. Palpable radial and pedal pulses bilaterally  Lungs: CTAB, no wheezes, rhonchi, or rales noted. Respiratory effort nonlabored  Abd: soft, tender on the left side of abdomen, ND, +BS, obese, no guarding, rebounding, or peritoneal signs, no masses, hernias, or organomegaly  MS: all 4 extremities are symmetrical with no cyanosis, clubbing, or edema.  Skin: warm and dry with no masses, lesions, or rashes  Psych: A&Ox3 with an appropriate affect.    Data Reviewed: Basic Metabolic Panel:  Lab 11/01/12 6295 10/29/12 1401  NA 141 141  K 3.6 3.8  CL 108 107  CO2 28 28  GLUCOSE 87 95  BUN 6 7  CREATININE 0.85 0.75  CALCIUM 8.5 9.5  MG -- 2.0  PHOS -- 2.8    Liver Function Tests:  Lab 11/01/12 0645 10/29/12 1401  AST 20 15  ALT 13 13  ALKPHOS 73 92  BILITOT 0.4 0.3  PROT 6.5 7.1  ALBUMIN 3.0* 3.4*   No results found for this basename: LIPASE:5,AMYLASE:5 in the last 168 hours No results found for this basename: AMMONIA:5 in the last 168 hours  CBC:  Lab 11/01/12 0645 10/31/12 1254 10/29/12 1401  WBC 6.9 6.6 6.9  NEUTROABS -- -- 3.2  HGB 11.6* 11.9* 12.9  HCT 35.8* 35.8* 37.7  MCV 84.2 83.3 81.6  PLT 299 284 308    Cardiac Enzymes: No results found for this basename: CKTOTAL:5,CKMB:5,CKMBINDEX:5,TROPONINI:5 in the last 168 hours BNP (last 3 results) No results found for this basename: PROBNP:3 in the last 8760 hours   CBG:  Lab 11/01/12 0816 10/31/12  0822 10/30/12 0734 10/29/12 1332  GLUCAP 95 105* 116* 114*    No results found for this or any previous visit (from the past 240 hour(s)).   Studies: Ct Abdomen Pelvis W Contrast  10/29/2012  *RADIOLOGY REPORT*  Clinical Data: Abdominal pain left lower quadrant pain, evaluate for abscess  CT ABDOMEN AND PELVIS WITH CONTRAST  Technique:  Multidetector CT imaging of the abdomen and pelvis was performed following the standard protocol during bolus administration of intravenous contrast.  Contrast: OMNIPAQUE IOHEXOL 300 MG/ML  SOLN  Comparison: 10/22/2012  Findings: Lung bases clear.  Minimal atelectasis.  Normal heart size.  No pericardial or pleural effusion.  Abdomen:  Prior cholecystectomy noted.  Stable right hepatic 5 mm focal hypodensity, too small to definitively characterize but suspect small hepatic cyst, image 19.  No biliary dilatation. Hepatic and portal veins are patent.  Biliary system, pancreas, spleen, adrenal glands, and kidneys demonstrate no acute finding and are within normal limits for age.  Negative for bowel obstruction, dilatation, ileus or free air.  No abdominal free fluid, fluid collection, hemorrhage, adenopathy, or abscess.  Pelvis:  Contrast opacifies the normal appendix.  Calcified right gonadal vein phlebolith noted, image 59 which is stable.  Terminal ileum is unremarkable.  In the left lower quadrant, there is improvement in the pericolonic focal inflammatory process centered in the pericolonic fat on image 69.  Adjacent colon does not demonstrated significant wall thickening or diverticular change.  The appearance compared to the prior study is more consistent with resolving acute epiploic appendagitis rather than diverticulitis.  No associate obstruction pattern or pelvic fluid collection.  Negative for abscess.  Prior hysterectomy noted.  No pelvic free fluid, fluid collection, hemorrhage, adenopathy, inguinal abnormality, hernia.  Urinary bladder unremarkable.  No  acute abnormal osseous finding.  IMPRESSION: Resolving left lower quadrant acute epiploic appendagitis. Negative for abscess.   Original Report Authenticated By: Judie Petit. Ruel Favors, M.D.    Ct Abdomen Pelvis W Contrast  10/22/2012  *RADIOLOGY REPORT*  Clinical Data: Left lower quadrant pain.  Question diverticulitis?  CT ABDOMEN AND PELVIS WITH CONTRAST  Technique:  Multidetector CT imaging of the abdomen and pelvis was performed following the standard protocol during bolus administration of intravenous contrast.  Contrast: OMNIPAQUE IOHEXOL 300 MG/ML  SOLN  Comparison: None.  Findings: Extraluminal inflammation at the junction of the descending colon and sigmoid colon most consistent with changes of diverticulitis without drainable abscess or free intraperitoneal air.  Minimal basilar atelectasis.  Cardiomegaly.  No abdominal aortic aneurysm.  Post cholecystectomy.  Focal fatty infiltration within the liver adjacent the falciform ligament.  Right lobe of liver 5 mm low density lesion too small to be characterized as a simple cyst.  No focal splenic, pancreatic, renal or adrenal lesion.  Appearance of prior hysterectomy.  Right ovary larger than the left containing small cysts/follicles.  Noncontrast filled views of the urinary bladder unremarkable.  No bony destructive lesion.  Scattered normal sized lymph nodes without adenopathy.  Mild diastasis rectus muscle without discrete bowel containing hernia.  IMPRESSION: Extraluminal inflammation at the junction of the descending colon and sigmoid colon most consistent with changes of diverticulitis without drainable abscess or free intraperitoneal air.  Please see above.  This has been made a PRA call report utilizing dashboard call feature.   Original Report Authenticated By: Fuller Canada, M.D.     Scheduled Meds:   . enoxaparin (LOVENOX) injection  70 mg Subcutaneous Q24H  . imipenem-cilastatin  500 mg Intravenous Q6H  . influenza  inactive virus  vaccine  0.5 mL Intramuscular Tomorrow-1000  . lip balm   Topical BID  . naproxen  500 mg Oral TID WC  . psyllium  1 packet Oral BID  . DISCONTD: ketorolac  30 mg Intravenous Q8H  . DISCONTD: lip balm  1 application Topical BID   Continuous Infusions:   . sodium chloride 75 mL/hr at 11/01/12 0816    Principal Problem:  *Epiploic appendagitis Active Problems:  Nausea & vomiting  Hypertension  Abdominal pain  Obesity, Class III, BMI 40-49.9 (morbid obesity)    Time spent: 40 minutes   [facility]  [facility] Pager [phone]. If 8PM-8AM, please contact night-coverage at [email or web address], password [facility] 11/01/2012, 9:24 AM  LOS: 3 days         [facility] PROGRESS NOTE  Olita Takeshita [id number]:[id number] DOB: 1971/08/10 DOA: 10/29/2012 PCP: Ron Parker, MD  Assessment/Plan: Principal Problem:  *Epiploic appendagitis Active Problems:  Nausea & vomiting  Hypertension  Abdominal pain  Obesity, Class III, BMI 40-49.9 (morbid obesity)  1.     Objective: Filed Vitals:   10/31/12 0518 10/31/12 1359 10/31/12 2136 11/01/12 0615  BP: 140/86 134/66 136/75 137/67  Pulse: 75 64 65 86  Temp: 98.6 F (37 C) 98.4 F (36.9 C) 98.2 F (36.8 C) 98.5 F (36.9 C)  TempSrc: Oral Oral Oral Oral  Resp: 18 19 18 18   Height:      Weight:      SpO2: 100% 100% 95% 100%    Intake/Output Summary (Last 24 hours) at 11/01/12 0925 Last data filed at 11/01/12 0600  Gross per 24 hour  Intake   2245 ml  Output   2200 ml  Net     45 ml    Exam:  HENT:  Head: Atraumatic.  Nose: Nose normal.  Mouth/Throat: Oropharynx is clear and moist.  Eyes: Conjunctivae are normal. Pupils are equal, round, and reactive to light. No scleral icterus.  Neck: Neck supple. No tracheal deviation present.  Cardiovascular: Normal rate, regular rhythm, normal heart sounds and intact distal pulses.  Pulmonary/Chest: Effort normal and breath sounds normal. No respiratory distress.   Abdominal: Soft. Normal appearance and bowel sounds are normal. She exhibits no distension. There is no tenderness.  Musculoskeletal: She exhibits no edema and no tenderness.  Neurological: She is alert. No cranial nerve deficit.    Data Reviewed: Basic Metabolic Panel:  Lab 11/01/12 0272 10/29/12 1401  NA 141 141  K 3.6 3.8  CL 108 107  CO2 28 28  GLUCOSE 87 95  BUN 6 7  CREATININE 0.85 0.75  CALCIUM 8.5 9.5  MG -- 2.0  PHOS -- 2.8    Liver Function Tests:  Lab 11/01/12 0645 10/29/12 1401  AST 20 15  ALT 13 13  ALKPHOS 73 92  BILITOT 0.4 0.3  PROT 6.5 7.1  ALBUMIN 3.0* 3.4*   No results found for this basename: LIPASE:5,AMYLASE:5 in the last 168 hours No results found for this basename: AMMONIA:5 in the last 168 hours  CBC:  Lab 11/01/12 0645 10/31/12 1254 10/29/12 1401  WBC 6.9 6.6 6.9  NEUTROABS -- -- 3.2  HGB 11.6* 11.9* 12.9  HCT 35.8* 35.8* 37.7  MCV 84.2 83.3 81.6  PLT 299 284 308    Cardiac Enzymes: No results found for this basename: CKTOTAL:5,CKMB:5,CKMBINDEX:5,TROPONINI:5 in the last 168 hours BNP (last 3 results) No results found for this basename: PROBNP:3 in the last 8760 hours   CBG:  Lab 11/01/12 0816 10/31/12 0822 10/30/12 0734 10/29/12 1332  GLUCAP 95 105* 116* 114*    No results found for this or any previous visit (from the past 240 hour(s)).   Studies: Ct Abdomen Pelvis W Contrast  10/29/2012  *RADIOLOGY REPORT*  Clinical Data: Abdominal pain left lower quadrant pain, evaluate for abscess  CT ABDOMEN AND PELVIS WITH CONTRAST  Technique:  Multidetector CT imaging of the abdomen and pelvis was performed following the standard protocol during bolus administration of intravenous contrast.  Contrast: OMNIPAQUE IOHEXOL 300 MG/ML  SOLN  Comparison: 10/22/2012  Findings: Lung bases clear.  Minimal atelectasis.  Normal heart size.  No pericardial or pleural effusion.  Abdomen:  Prior cholecystectomy noted.  Stable right hepatic 5 mm  focal hypodensity, too small to definitively characterize but suspect small hepatic cyst, image 19.  No biliary dilatation. Hepatic and portal veins are patent.  Biliary system, pancreas, spleen, adrenal glands, and kidneys demonstrate no acute finding and are within normal limits for age.  Negative for bowel obstruction, dilatation, ileus or free air.  No abdominal free fluid, fluid collection, hemorrhage, adenopathy, or abscess.  Pelvis:  Contrast opacifies the normal appendix.  Calcified right gonadal vein phlebolith noted, image 59 which is stable.  Terminal ileum is unremarkable.  In the left lower quadrant, there is improvement in the pericolonic focal inflammatory process centered in the pericolonic fat on image 69.  Adjacent colon does not demonstrated significant wall thickening or diverticular change.  The appearance compared to the prior study is more consistent with resolving acute epiploic appendagitis rather than diverticulitis.  No associate obstruction pattern or pelvic fluid collection.  Negative for abscess.  Prior hysterectomy noted.  No pelvic free fluid, fluid collection, hemorrhage, adenopathy, inguinal abnormality, hernia.  Urinary bladder unremarkable.  No acute abnormal osseous finding.  IMPRESSION: Resolving left lower quadrant acute epiploic appendagitis. Negative for abscess.   Original Report Authenticated By: Judie Petit. Ruel Favors, M.D.    Ct Abdomen Pelvis W Contrast  10/22/2012  *RADIOLOGY REPORT*  Clinical Data: Left lower quadrant pain.  Question diverticulitis?  CT ABDOMEN AND PELVIS WITH CONTRAST  Technique:  Multidetector CT imaging of the abdomen and pelvis was performed following the standard protocol during bolus administration of intravenous contrast.  Contrast: OMNIPAQUE IOHEXOL 300 MG/ML  SOLN  Comparison: None.  Findings: Extraluminal inflammation at the junction of the descending colon and sigmoid colon most consistent with changes of diverticulitis without drainable  abscess or free intraperitoneal air.  Minimal basilar atelectasis.  Cardiomegaly.  No abdominal aortic aneurysm.  Post cholecystectomy.  Focal fatty infiltration within the liver adjacent the falciform ligament.  Right lobe of liver 5 mm low density lesion too small to be characterized as a simple cyst.  No focal splenic, pancreatic, renal or adrenal lesion.  Appearance of prior hysterectomy. Right ovary larger than the left containing small cysts/follicles.  Noncontrast filled views of the urinary bladder unremarkable.  No bony destructive lesion.  Scattered normal sized lymph nodes without adenopathy.  Mild diastasis rectus muscle without discrete bowel containing hernia.  IMPRESSION: Extraluminal inflammation at the junction of the descending colon and sigmoid colon most consistent with changes of diverticulitis without drainable abscess or free intraperitoneal air.  Please see above.  This has been made a PRA call report utilizing dashboard call feature.   Original Report Authenticated By: Fuller Canada, M.D.     Scheduled Meds:   . enoxaparin (LOVENOX) injection  70 mg Subcutaneous Q24H  . imipenem-cilastatin  500 mg Intravenous Q6H  . influenza  inactive virus vaccine  0.5 mL Intramuscular Tomorrow-1000  . lip balm   Topical BID  . naproxen  500 mg Oral TID WC  . psyllium  1 packet Oral BID  . DISCONTD: ketorolac  30 mg Intravenous Q8H  . DISCONTD: lip balm  1 application Topical BID   Continuous Infusions:   . sodium chloride 75 mL/hr at 11/01/12 0816    Principal Problem:  *Epiploic appendagitis Active Problems:  Nausea & vomiting  Hypertension  Abdominal pain  Obesity, Class III, BMI 40-49.9 (morbid obesity)    Time spent: 40 minutes   [facility]  [facility] Pager [phone]. If 8PM-8AM, please contact night-coverage at [email or web address], password [facility] 11/01/2012, 9:25 AM  LOS: 3 days

## 2012-11-01 NOTE — Progress Notes (Signed)
Jasmine Roberson [id number] Jun 08, 1971   Subjective:  Pt c/o moderate pain/soreness.  RLQ>periumb>>periumb N/V x3 yest.  Clearance Coots now IV meds help w pain  Objective:  Vital signs:  Filed Vitals:   10/31/12 0518 10/31/12 1359 10/31/12 2136 11/01/12 0615  BP: 140/86 134/66 136/75 137/67  Pulse: 75 64 65 86  Temp: 98.6 F (37 C) 98.4 F (36.9 C) 98.2 F (36.8 C) 98.5 F (36.9 C)  TempSrc: Oral Oral Oral Oral  Resp: 18 19 18 18   Height:      Weight:      SpO2: 100% 100% 95% 100%    Last BM Date: 10/30/12  Intake/Output   Yesterday:  11/01 0701 - 11/02 0700 In: 2245 [I.V.:2245] Out: 2200 [Urine:2200] This shift:     Bowel function:  Flatus: y  BM: x1  Physical Exam:  General: Pt awake/alert/oriented x4 in no acute distress Eyes: PERRL, normal EOM.  Sclera clear.  No icterus Neuro: CN II-XII intact w/o focal sensory/motor deficits. Lymph: No head/neck/groin lymphadenopathy Psych:  No delerium/psychosis/paranoia HENT: Normocephalic, Mucus membranes moist.  No thrush Neck: Supple, No tracheal deviation Chest: No chest wall pain w good excursion CV:  Pulses intact.  Regular rhythm Abdomen: Soft.  Obese.  Nondistended.  Mod tender LLQ.  + peritoneal irritation w cough/percussion to LLQ only.  No incarcerated hernias. Ext:  SCDs BLE.  No mjr edema.  No cyanosis Skin: No petechiae / purpurae  Problem List:  Principal Problem:  *Epiploic appendagitis Active Problems:  Nausea & vomiting  Hypertension  Abdominal pain   Assessment  Jasmine Roberson  41 y.o. female       Pain w N/V but clinically stable  Plan:  Try inc PO Bowel regimen Cont IV ABx given persistent Sx for many weeks Try change IV to Po meds Repeat CT scan in 3-4 days if not better Mobilize - get her up more! -VTE prophylaxis- SCDs, etc -mobilize as tolerated to help recovery  Ardeth Sportsman, M.D., F.A.C.S. Gastrointestinal [facility]ally [facility] [address] [place], [place] [address] [phone] Main / Paging [phone] Voice Mail   11/01/2012  CARE TEAM:  PCP: Ron Parker, MD  Outpatient Care Team: Patient Care Team: Ron Parker, MD as PCP - General (Internal Medicine)  Inpatient Treatment Team: Treatment Team: Attending Provider: Richarda Overlie, MD; Registered Nurse: Magnus Sinning, RN; Rounding Team: Mahala Menghini, MD; Technician: Lowell Bouton, NT; Registered Nurse: Wynona Neat, RN; Consulting Physician: Md Montez Morita, MD; Technician: Star Age, NT; Registered Nurse: Launa Flight, RN   Results:   Labs: Results for orders placed during the hospital encounter of 10/29/12 (from the past 48 hour(s))  GLUCOSE, CAPILLARY     Status: Abnormal   Collection Time   10/31/12  8:22 AM      Component Value Range Comment   Glucose-Capillary 105 (*) 70 - 99 mg/dL    Comment 1 Documented in Chart      Comment 2 Notify RN     CBC     Status: Abnormal   Collection Time   10/31/12 12:54 PM      Component Value Range Comment   WBC 6.6  4.0 - 10.5 K/uL    RBC 4.30  3.87 - 5.11 MIL/uL    Hemoglobin 11.9 (*) 12.0 - 15.0 g/dL    HCT 95.2 (*) 84.1 - 46.0 %    MCV 83.3  78.0 - 100.0 fL  MCH 27.7  26.0 - 34.0 pg    MCHC 33.2  30.0 - 36.0 g/dL    RDW 96.0  45.4 - 09.8 %    Platelets 284  150 - 400 K/uL   CBC     Status: Abnormal   Collection Time   11/01/12  6:45 AM      Component Value Range Comment   WBC 6.9  4.0 - 10.5 K/uL    RBC 4.25  3.87 - 5.11 MIL/uL    Hemoglobin 11.6 (*) 12.0 - 15.0 g/dL    HCT 11.9 (*) 14.7 - 46.0 %    MCV 84.2  78.0 - 100.0 fL    MCH 27.3  26.0 - 34.0 pg    MCHC 32.4  30.0 - 36.0 g/dL    RDW 82.9  56.2 - 13.0 %    Platelets 299  150 - 400 K/uL     Imaging / Studies: No results found.  Medications / Allergies: per chart  Antibiotics: Anti-infectives     Start     Dose/Rate Route Frequency Ordered Stop   10/30/12 1100    imipenem-cilastatin (PRIMAXIN) 500 mg in sodium chloride 0.9 % 100 mL IVPB        500 mg 200 mL/hr over 30 Minutes Intravenous 4 times per day 10/30/12 1036     10/30/12 1030   piperacillin-tazobactam (ZOSYN) IVPB 3.375 g  Status:  Discontinued        3.375 g 12.5 mL/hr over 240 Minutes Intravenous 3 times per day 10/30/12 1018 10/30/12 1032   10/29/12 1500   ciprofloxacin (CIPRO) IVPB 400 mg  Status:  Discontinued        400 mg 200 mL/hr over 60 Minutes Intravenous Every 12 hours 10/29/12 1445 10/30/12 1017   10/29/12 1500   metroNIDAZOLE (FLAGYL) IVPB 500 mg  Status:  Discontinued        500 mg 100 mL/hr over 60 Minutes Intravenous Every 8 hours 10/29/12 1445 10/30/12 1017

## 2012-11-02 LAB — CBC
HCT: 35.4 % — ABNORMAL LOW (ref 36.0–46.0)
Hemoglobin: 11.6 g/dL — ABNORMAL LOW (ref 12.0–15.0)
RDW: 13.5 % (ref 11.5–15.5)
WBC: 6.6 10*3/uL (ref 4.0–10.5)

## 2012-11-02 LAB — GLUCOSE, CAPILLARY: Glucose-Capillary: 90 mg/dL (ref 70–99)

## 2012-11-02 MED ORDER — PANTOPRAZOLE SODIUM 40 MG IV SOLR
40.0000 mg | INTRAVENOUS | Status: DC
  Administered 2012-11-02 – 2012-11-04 (×3): 40 mg via INTRAVENOUS
  Filled 2012-11-02 (×3): qty 40

## 2012-11-02 MED ORDER — PROMETHAZINE HCL 25 MG/ML IJ SOLN
12.5000 mg | Freq: Four times a day (QID) | INTRAMUSCULAR | Status: DC | PRN
  Administered 2012-11-03: 25 mg via INTRAVENOUS
  Filled 2012-11-02: qty 1

## 2012-11-02 MED ORDER — TRAMADOL HCL 50 MG PO TABS: 50.0000 mg | ORAL_TABLET | Freq: Four times a day (QID) | ORAL | Status: DC | PRN

## 2012-11-02 MED ORDER — IBUPROFEN 800 MG PO TABS
800.0000 mg | ORAL_TABLET | Freq: Four times a day (QID) | ORAL | Status: DC
  Filled 2012-11-02 (×4): qty 1

## 2012-11-02 MED ORDER — PROMETHAZINE HCL 25 MG/ML IJ SOLN
12.5000 mg | Freq: Four times a day (QID) | INTRAMUSCULAR | Status: AC
  Administered 2012-11-02: 12.5 mg via INTRAVENOUS
  Administered 2012-11-02: 18:00:00 via INTRAVENOUS
  Administered 2012-11-03 (×2): 12.5 mg via INTRAVENOUS
  Filled 2012-11-02 (×4): qty 1

## 2012-11-02 MED ORDER — FENTANYL CITRATE 0.05 MG/ML IJ SOLN
25.0000 ug | INTRAMUSCULAR | Status: DC | PRN
  Administered 2012-11-02 – 2012-11-03 (×5): 50 ug via INTRAVENOUS
  Filled 2012-11-02 (×5): qty 2

## 2012-11-02 MED ORDER — METOCLOPRAMIDE HCL 5 MG/ML IJ SOLN: 5.0000 mg | Freq: Four times a day (QID) | INTRAMUSCULAR | Status: DC | PRN

## 2012-11-02 NOTE — Progress Notes (Signed)
[facility] PROGRESS NOTE  Fannie Gathright ZOX:[id number] DOB: February 26, 1971 DOA: 10/29/2012 PCP: Ron Parker, MD  Assessment/Plan: Principal Problem:  *Epiploic appendagitis Active Problems:  Nausea & vomiting  Hypertension  Abdominal pain  Obesity, Class III, BMI 40-49.9 (morbid obesity)    left lower quadrant acute epiploic appendagitis/patient has already had 2 CAT scans done in the last 2 weeks Initially suspected to have diverticulitis, Surgery following, on IV imipenem Repeat CT scan tomorrow morning Discontinued ibuprofen Passing flatness, tolerating clear liquids for now    Code Status: full Family Communication: family updated about patient's clinical progress Disposition Plan:  As above    Brief narrative:  This is a 41 yo female who developed periumbilical abdominal pain around 3 weeks again. It began radiating to her LLQ. She initially had no nausea or vomiting. She does admit to some diarrhea. She was evaluated and sent for a CT scan that questioned diverticulitis. She was started as an outpatient on Cipro and Flagyl. She developed nausea and vomiting secondary to her antibiotics. She went back and saw her PCP who direct admitted her for further care. She denies any fevers or chills. She has had a CT scan during this admission which reveals epiploic appendagitis with improved stranding noted compared to prior CT scan. Due to continued pain, we have been asked to see for further recommendations.  Consultants:  General surgery None Antibiotics:  Imipenem HPI/Subjective:  Pt c/o moderate pain/soreness. RLQ>periumb>>periumb  N/V x3 yest. Sips now  Objective: Filed Vitals:   11/01/12 0615 11/01/12 1431 11/01/12 2113 11/02/12 0615  BP: 137/67 127/70 146/87 149/79  Pulse: 86 78 72 70  Temp: 98.5 F (36.9 C) 98.2 F (36.8 C) 98.4 F (36.9 C) 98.4 F (36.9 C)  TempSrc: Oral Oral Oral   Resp: 18 16 17 18   Height:      Weight:      SpO2: 100%  94% 100% 100%    Intake/Output Summary (Last 24 hours) at 11/02/12 1001 Last data filed at 11/01/12 2114  Gross per 24 hour  Intake      0 ml  Output    300 ml  Net   -300 ml    Exam:  HENT:  Head: Atraumatic.  Nose: Nose normal.  Mouth/Throat: Oropharynx is clear and moist.  Eyes: Conjunctivae are normal. Pupils are equal, round, and reactive to light. No scleral icterus.  Neck: Neck supple. No tracheal deviation present.  Cardiovascular: Normal rate, regular rhythm, normal heart sounds and intact distal pulses.  Pulmonary/Chest: Effort normal and breath sounds normal. No respiratory distress.  Abdominal: Soft. Normal appearance and bowel sounds are normal. She exhibits no distension. There is no tenderness.  Musculoskeletal: She exhibits no edema and no tenderness.  Neurological: She is alert. No cranial nerve deficit.    Data Reviewed: Basic Metabolic Panel:  Lab 11/01/12 8119 10/29/12 1401  NA 141 141  K 3.6 3.8  CL 108 107  CO2 28 28  GLUCOSE 87 95  BUN 6 7  CREATININE 0.85 0.75  CALCIUM 8.5 9.5  MG -- 2.0  PHOS -- 2.8    Liver Function Tests:  Lab 11/01/12 0645 10/29/12 1401  AST 20 15  ALT 13 13  ALKPHOS 73 92  BILITOT 0.4 0.3  PROT 6.5 7.1  ALBUMIN 3.0* 3.4*   No results found for this basename: LIPASE:5,AMYLASE:5 in the last 168 hours No results found for this basename: AMMONIA:5 in the last 168 hours  CBC:  Lab 11/02/12 0630 11/01/12  1610 10/31/12 1254 10/29/12 1401  WBC 6.6 6.9 6.6 6.9  NEUTROABS -- -- -- 3.2  HGB 11.6* 11.6* 11.9* 12.9  HCT 35.4* 35.8* 35.8* 37.7  MCV 83.7 84.2 83.3 81.6  PLT 281 299 284 308    Cardiac Enzymes: No results found for this basename: CKTOTAL:5,CKMB:5,CKMBINDEX:5,TROPONINI:5 in the last 168 hours BNP (last 3 results) No results found for this basename: PROBNP:3 in the last 8760 hours   CBG:  Lab 11/01/12 0816 10/31/12 0822 10/30/12 0734 10/29/12 1332  GLUCAP 95 105* 116* 114*    No results found for  this or any previous visit (from the past 240 hour(s)).   Studies: Ct Abdomen Pelvis W Contrast  10/29/2012  *RADIOLOGY REPORT*  Clinical Data: Abdominal pain left lower quadrant pain, evaluate for abscess  CT ABDOMEN AND PELVIS WITH CONTRAST  Technique:  Multidetector CT imaging of the abdomen and pelvis was performed following the standard protocol during bolus administration of intravenous contrast.  Contrast: OMNIPAQUE IOHEXOL 300 MG/ML  SOLN  Comparison: 10/22/2012  Findings: Lung bases clear.  Minimal atelectasis.  Normal heart size.  No pericardial or pleural effusion.  Abdomen:  Prior cholecystectomy noted.  Stable right hepatic 5 mm focal hypodensity, too small to definitively characterize but suspect small hepatic cyst, image 19.  No biliary dilatation. Hepatic and portal veins are patent.  Biliary system, pancreas, spleen, adrenal glands, and kidneys demonstrate no acute finding and are within normal limits for age.  Negative for bowel obstruction, dilatation, ileus or free air.  No abdominal free fluid, fluid collection, hemorrhage, adenopathy, or abscess.  Pelvis:  Contrast opacifies the normal appendix.  Calcified right gonadal vein phlebolith noted, image 59 which is stable.  Terminal ileum is unremarkable.  In the left lower quadrant, there is improvement in the pericolonic focal inflammatory process centered in the pericolonic fat on image 69.  Adjacent colon does not demonstrated significant wall thickening or diverticular change.  The appearance compared to the prior study is more consistent with resolving acute epiploic appendagitis rather than diverticulitis.  No associate obstruction pattern or pelvic fluid collection.  Negative for abscess.  Prior hysterectomy noted.  No pelvic free fluid, fluid collection, hemorrhage, adenopathy, inguinal abnormality, hernia.  Urinary bladder unremarkable.  No acute abnormal osseous finding.  IMPRESSION: Resolving left lower quadrant acute epiploic  appendagitis. Negative for abscess.   Original Report Authenticated By: Judie Petit. Ruel Favors, M.D.    Ct Abdomen Pelvis W Contrast  10/22/2012  *RADIOLOGY REPORT*  Clinical Data: Left lower quadrant pain.  Question diverticulitis?  CT ABDOMEN AND PELVIS WITH CONTRAST  Technique:  Multidetector CT imaging of the abdomen and pelvis was performed following the standard protocol during bolus administration of intravenous contrast.  Contrast: OMNIPAQUE IOHEXOL 300 MG/ML  SOLN  Comparison: None.  Findings: Extraluminal inflammation at the junction of the descending colon and sigmoid colon most consistent with changes of diverticulitis without drainable abscess or free intraperitoneal air.  Minimal basilar atelectasis.  Cardiomegaly.  No abdominal aortic aneurysm.  Post cholecystectomy.  Focal fatty infiltration within the liver adjacent the falciform ligament.  Right lobe of liver 5 mm low density lesion too small to be characterized as a simple cyst.  No focal splenic, pancreatic, renal or adrenal lesion.  Appearance of prior hysterectomy. Right ovary larger than the left containing small cysts/follicles.  Noncontrast filled views of the urinary bladder unremarkable.  No bony destructive lesion.  Scattered normal sized lymph nodes without adenopathy.  Mild diastasis rectus muscle  without discrete bowel containing hernia.  IMPRESSION: Extraluminal inflammation at the junction of the descending colon and sigmoid colon most consistent with changes of diverticulitis without drainable abscess or free intraperitoneal air.  Please see above.  This has been made a PRA call report utilizing dashboard call feature.   Original Report Authenticated By: Fuller Canada, M.D.     Scheduled Meds:   . enoxaparin (LOVENOX) injection  70 mg Subcutaneous Q24H  . ibuprofen  800 mg Oral QID  . imipenem-cilastatin  500 mg Intravenous Q6H  . lip balm   Topical BID  . promethazine  12.5 mg Intravenous Q6H  . psyllium  1 packet  Oral BID  . [COMPLETED] white petrolatum      . [DISCONTINUED] naproxen  500 mg Oral TID WC   Continuous Infusions:   . sodium chloride 75 mL/hr at 11/02/12 0909    Principal Problem:  *Epiploic appendagitis Active Problems:  Nausea & vomiting  Hypertension  Abdominal pain  Obesity, Class III, BMI 40-49.9 (morbid obesity)    Time spent: 40 minutes   [facility]  [facility] Pager [phone]. If 8PM-8AM, please contact night-coverage at [email or web address], password [facility] 11/02/2012, 10:01 AM  LOS: 4 days

## 2012-11-02 NOTE — Progress Notes (Signed)
Jasmine Roberson [id number] [birth year]   Subjective:  Still c/o moderate pain/soreness.  L flank > LLQ, especially when moving Walking more in hallways +flatus more Nausea persists.  Trying liquids Naproxen not helping Husband in room  Objective:  Vital signs:  Filed Vitals:   11/01/12 0615 11/01/12 1431 11/01/12 2113 11/02/12 0615  BP: 137/67 127/70 146/87 149/79  Pulse: 86 78 72 70  Temp: 98.5 F (36.9 C) 98.2 F (36.8 C) 98.4 F (36.9 C) 98.4 F (36.9 C)  TempSrc: Oral Oral Oral   Resp: 18 16 17 18   Height:      Weight:      SpO2: 100% 94% 100% 100%    Last BM Date: 10/31/12  Intake/Output   Yesterday:  11/02 0701 - 11/03 0700 In: -  Out: 300 [Urine:300] This shift:     Bowel function:  Flatus: y  BM: No  Physical Exam:  General: Pt awake/alert/oriented x4 in no acute distress.  Looks more relaxed Eyes: PERRL, normal EOM.  Sclera clear.  No icterus Neuro: CN II-XII intact w/o focal sensory/motor deficits. Lymph: No head/neck/groin lymphadenopathy Psych:  No delerium/psychosis/paranoia HENT: Normocephalic, Mucus membranes moist.  No thrush Neck: Supple, No tracheal deviation Chest: No chest wall pain w good excursion CV:  Pulses intact.  Regular rhythm Abdomen: Soft.  Obese.  Nondistended.  Mod tender L flank.  Mild peritoneal irritation w cough/percussion to LLQ only.  No incarcerated hernias. Ext:  SCDs BLE.  No mjr edema.  No cyanosis Skin: No petechiae / purpurae  Problem List:  Principal Problem:  *Epiploic appendagitis Active Problems:  Nausea & vomiting  Hypertension  Abdominal pain  Obesity, Class III, BMI 40-49.9 (morbid obesity)   Assessment  Jasmine Roberson  41 y.o. female       Pain w N/V but clinically better/stable.  More active  Plan: Try RTC phenergan x 24hr Liquids PO Bowel regimen Cont IV ABx given persistent Sx for many weeks Repeat CT scan in 1-2 days if not better Mobilize even more VTE prophylaxis-  SCDs, etc   Ardeth Sportsman, M.D., F.A.C.S. Gastrointestinal and Minimally Invasive Surgery [facility] [address] [place], [place] [address] [phone] Main / Paging [phone] Voice Mail   11/02/2012  CARE TEAM:  PCP: Ron Parker, MD  Outpatient Care Team: Patient Care Team: Ron Parker, MD as PCP - General (Internal Medicine)  Inpatient Treatment Team: Treatment Team: Attending Provider: Richarda Overlie, MD; Registered Nurse: Magnus Sinning, RN; Rounding Team: Mahala Menghini, MD; Technician: Lowell Bouton, NT; Registered Nurse: Wynona Neat, RN; Consulting Physician: Md Montez Morita, MD; Technician: Star Age, NT; Registered Nurse: Launa Flight, RN; Technician: Carman Ching, NT; Technician: Chauncey Reading, [place]; Registered Nurse: Dennard Schaumann [place], RN   Results:   Labs: Results for orders placed during the hospital encounter of 10/29/12 (from the past 48 hour(s))  CBC     Status: Abnormal   Collection Time   10/31/12 12:54 PM      Component Value Range Comment   WBC 6.6  4.0 - 10.5 K/uL    RBC 4.30  3.87 - 5.11 MIL/uL    Hemoglobin 11.9 (*) 12.0 - 15.0 g/dL    HCT 95.2 (*) 84.1 - 46.0 %    MCV 83.3  78.0 - 100.0 fL    MCH 27.7  26.0 - 34.0 pg    MCHC 33.2  30.0 - 36.0 g/dL    RDW 13.6  11.5 - 15.5 %    Platelets 284  150 - 400 K/uL   CBC     Status: Abnormal   Collection Time   11/01/12  6:45 AM      Component Value Range Comment   WBC 6.9  4.0 - 10.5 K/uL    RBC 4.25  3.87 - 5.11 MIL/uL    Hemoglobin 11.6 (*) 12.0 - 15.0 g/dL    HCT 16.1 (*) 09.6 - 46.0 %    MCV 84.2  78.0 - 100.0 fL    MCH 27.3  26.0 - 34.0 pg    MCHC 32.4  30.0 - 36.0 g/dL    RDW 04.5  40.9 - 81.1 %    Platelets 299  150 - 400 K/uL   COMPREHENSIVE METABOLIC PANEL     Status: Abnormal   Collection Time   11/01/12  6:45 AM      Component Value Range Comment   Sodium 141  135 - 145 mEq/L    Potassium 3.6  3.5 - 5.1  mEq/L    Chloride 108  96 - 112 mEq/L    CO2 28  19 - 32 mEq/L    Glucose, Bld 87  70 - 99 mg/dL    BUN 6  6 - 23 mg/dL    Creatinine, Ser 9.14  0.50 - 1.10 mg/dL    Calcium 8.5  8.4 - 78.2 mg/dL    Total Protein 6.5  6.0 - 8.3 g/dL    Albumin 3.0 (*) 3.5 - 5.2 g/dL    AST 20  0 - 37 U/L    ALT 13  0 - 35 U/L    Alkaline Phosphatase 73  39 - 117 U/L    Total Bilirubin 0.4  0.3 - 1.2 mg/dL    GFR calc non Af Amer 83 (*) >90 mL/min    GFR calc Af Amer >90  >90 mL/min   GLUCOSE, CAPILLARY     Status: Normal   Collection Time   11/01/12  8:16 AM      Component Value Range Comment   Glucose-Capillary 95  70 - 99 mg/dL    Comment 1 Documented in Chart      Comment 2 Notify RN     CBC     Status: Abnormal   Collection Time   11/02/12  6:30 AM      Component Value Range Comment   WBC 6.6  4.0 - 10.5 K/uL    RBC 4.23  3.87 - 5.11 MIL/uL    Hemoglobin 11.6 (*) 12.0 - 15.0 g/dL    HCT 95.6 (*) 21.3 - 46.0 %    MCV 83.7  78.0 - 100.0 fL    MCH 27.4  26.0 - 34.0 pg    MCHC 32.8  30.0 - 36.0 g/dL    RDW 08.6  57.8 - 46.9 %    Platelets 281  150 - 400 K/uL     Imaging / Studies: No results found.  Medications / Allergies: per chart  Antibiotics: Anti-infectives     Start     Dose/Rate Route Frequency Ordered Stop   10/30/12 1100  imipenem-cilastatin (PRIMAXIN) 500 mg in sodium chloride 0.9 % 100 mL IVPB       500 mg 200 mL/hr over 30 Minutes Intravenous 4 times per day 10/30/12 1036     10/30/12 1030   piperacillin-tazobactam (ZOSYN) IVPB 3.375 g  Status:  Discontinued        3.375 g 12.5  mL/hr over 240 Minutes Intravenous 3 times per day 10/30/12 1018 10/30/12 1032   10/29/12 1500   ciprofloxacin (CIPRO) IVPB 400 mg  Status:  Discontinued        400 mg 200 mL/hr over 60 Minutes Intravenous Every 12 hours 10/29/12 1445 10/30/12 1017   10/29/12 1500   metroNIDAZOLE (FLAGYL) IVPB 500 mg  Status:  Discontinued        500 mg 100 mL/hr over 60 Minutes Intravenous Every 8 hours  10/29/12 1445 10/30/12 1017

## 2012-11-03 ENCOUNTER — Inpatient Hospital Stay (HOSPITAL_COMMUNITY): Payer: BC Managed Care – PPO

## 2012-11-03 ENCOUNTER — Encounter (HOSPITAL_COMMUNITY): Payer: Self-pay | Admitting: *Deleted

## 2012-11-03 DIAGNOSIS — K5289 Other specified noninfective gastroenteritis and colitis: Secondary | ICD-10-CM

## 2012-11-03 LAB — GLUCOSE, CAPILLARY: Glucose-Capillary: 110 mg/dL — ABNORMAL HIGH (ref 70–99)

## 2012-11-03 MED ORDER — IOHEXOL 300 MG/ML  SOLN
100.0000 mL | Freq: Once | INTRAMUSCULAR | Status: AC | PRN
  Administered 2012-11-03: 100 mL via INTRAVENOUS

## 2012-11-03 MED ORDER — TRAMADOL HCL 50 MG PO TABS
50.0000 mg | ORAL_TABLET | Freq: Four times a day (QID) | ORAL | Status: DC | PRN
  Filled 2012-11-03: qty 1

## 2012-11-03 MED ORDER — IOHEXOL 300 MG/ML  SOLN
20.0000 mL | INTRAMUSCULAR | Status: AC
  Administered 2012-11-03: 20 mL via ORAL

## 2012-11-03 MED ORDER — ACETAMINOPHEN 500 MG PO TABS
1000.0000 mg | ORAL_TABLET | Freq: Three times a day (TID) | ORAL | Status: DC
  Administered 2012-11-04 – 2012-11-05 (×4): 1000 mg via ORAL
  Filled 2012-11-03 (×7): qty 2

## 2012-11-03 MED ORDER — AMLODIPINE BESYLATE 10 MG PO TABS
10.0000 mg | ORAL_TABLET | Freq: Every day | ORAL | Status: DC
  Administered 2012-11-03 – 2012-11-05 (×3): 10 mg via ORAL
  Filled 2012-11-03 (×3): qty 1

## 2012-11-03 MED ORDER — PROMETHAZINE HCL 12.5 MG PO TABS
12.5000 mg | ORAL_TABLET | Freq: Three times a day (TID) | ORAL | Status: DC
  Administered 2012-11-04 (×3): 12.5 mg via ORAL
  Filled 2012-11-03 (×6): qty 1

## 2012-11-03 MED ORDER — TRAMADOL HCL 50 MG PO TABS
50.0000 mg | ORAL_TABLET | Freq: Four times a day (QID) | ORAL | Status: DC
  Administered 2012-11-03 – 2012-11-05 (×7): 50 mg via ORAL
  Filled 2012-11-03 (×12): qty 1

## 2012-11-03 NOTE — Progress Notes (Signed)
ANTIBIOTIC CONSULT NOTE - FOLLOW UP  Pharmacy Consult for Primaxin Indication: diverticulitis  Allergies  Allergen Reactions  . Morphine And Related   . Penicillins     Hives and lip swelling  . Zofran (Ondansetron Hcl)     Patient Measurements: Height: 5\' 8"  (172.7 cm) Weight: 320 lb (145.151 kg) IBW/kg (Calculated) : 63.9   Vital Signs: Temp: 97.9 F (36.6 C) (11/04 0534) Temp src: Oral (11/04 0534) BP: 153/81 mmHg (11/04 0534) Pulse Rate: 62  (11/04 0534) Intake/Output from previous day: 11/03 0701 - 11/04 0700 In: 600 [I.V.:600] Out: -  Intake/Output from this shift: Total I/O In: -  Out: 300 [Urine:300]  Labs:  [facility] 11/02/12 0630 11/01/12 0645 10/31/12 1254  WBC 6.6 6.9 6.6  HGB 11.6* 11.6* 11.9*  PLT 281 299 284  LABCREA -- -- --  CREATININE -- 0.85 --   Estimated Creatinine Clearance: 131.2 ml/min (by C-G formula based on Cr of 0.85). No results found for this basename: VANCOTROUGH:2,VANCOPEAK:2,VANCORANDOM:2,GENTTROUGH:2,GENTPEAK:2,GENTRANDOM:2,TOBRATROUGH:2,TOBRAPEAK:2,TOBRARND:2,AMIKACINPEAK:2,AMIKACINTROU:2,AMIKACIN:2, in the last 72 hours   Microbiology: No results found for this or any previous visit (from the past 720 hour(s)).  Anti-infectives     Start     Dose/Rate Route Frequency Ordered Stop   10/30/12 1100   imipenem-cilastatin (PRIMAXIN) 500 mg in sodium chloride 0.9 % 100 mL IVPB        500 mg 200 mL/hr over 30 Minutes Intravenous 4 times per day 10/30/12 1036     10/30/12 1030   piperacillin-tazobactam (ZOSYN) IVPB 3.375 g  Status:  Discontinued        3.375 g 12.5 mL/hr over 240 Minutes Intravenous 3 times per day 10/30/12 1018 10/30/12 1032   10/29/12 1500   ciprofloxacin (CIPRO) IVPB 400 mg  Status:  Discontinued        400 mg 200 mL/hr over 60 Minutes Intravenous Every 12 hours 10/29/12 1445 10/30/12 1017   10/29/12 1500   metroNIDAZOLE (FLAGYL) IVPB 500 mg  Status:  Discontinued        500 mg 100 mL/hr over 60 Minutes  Intravenous Every 8 hours 10/29/12 1445 10/30/12 1017          Assessment: Diverticulitis:  On Day #5 of empiric therapy with Primaxin, broadened 10/31 from Cipro/Flagyl.  She remains afebrile. Repeat CT pending for today.   Goal of Therapy:  Eradication of infection  Plan:  Continue Primaxin 500mg  IV q6h Monitor renal function and clinical condition.  Estella Husk, Pharm.D., BCPS Clinical Pharmacist  Phone [phone] Pager [phone] 11/03/2012, 10:13 AM

## 2012-11-03 NOTE — Progress Notes (Signed)
Patient ID: Jasmine Roberson, female   DOB: [birth year], 41 y.o.   MRN: [id number]    Subjective: Pt still c/o pain, but laying in bed comfortably.  Drinking some clears, but says she is still nauseated.  Objective: Vital signs in last 24 hours: Temp:  [97.9 F (36.6 C)-98.3 F (36.8 C)] 97.9 F (36.6 C) (11/04 0534) Pulse Rate:  [62-76] 62  (11/04 0534) Resp:  [15-19] 15  (11/04 0534) BP: (153-167)/(81-88) 153/81 mmHg (11/04 0534) SpO2:  [99 %-100 %] 99 % (11/04 0534) Last BM Date: 10/31/12  Intake/Output from previous day: 11/03 0701 - 11/04 0700 In: 600 [I.V.:600] Out: -  Intake/Output this shift:    PE: Abd: soft, tender on left side, +BS, ND, obese Heart: regular Lungs: CTAB  Lab Results:   Basename 11/02/12 0630 11/01/12 0645  WBC 6.6 6.9  HGB 11.6* 11.6*  HCT 35.4* 35.8*  PLT 281 299   BMET  Basename 11/01/12 0645  NA 141  K 3.6  CL 108  CO2 28  GLUCOSE 87  BUN 6  CREATININE 0.85  CALCIUM 8.5   PT/INR No results found for this basename: LABPROT:2,INR:2 in the last 72 hours CMP     Component Value Date/Time   NA 141 11/01/2012 0645   K 3.6 11/01/2012 0645   CL 108 11/01/2012 0645   CO2 28 11/01/2012 0645   GLUCOSE 87 11/01/2012 0645   BUN 6 11/01/2012 0645   CREATININE 0.85 11/01/2012 0645   CALCIUM 8.5 11/01/2012 0645   PROT 6.5 11/01/2012 0645   ALBUMIN 3.0* 11/01/2012 0645   AST 20 11/01/2012 0645   ALT 13 11/01/2012 0645   ALKPHOS 73 11/01/2012 0645   BILITOT 0.4 11/01/2012 0645   GFRNONAA 83* 11/01/2012 0645   GFRAA >90 11/01/2012 0645   Lipase  No results found for this basename: lipase       Studies/Results: No results found.  Anti-infectives: Anti-infectives     Start     Dose/Rate Route Frequency Ordered Stop   10/30/12 1100   imipenem-cilastatin (PRIMAXIN) 500 mg in sodium chloride 0.9 % 100 mL IVPB        500 mg 200 mL/hr over 30 Minutes Intravenous 4 times per day 10/30/12 1036     10/30/12 1030   piperacillin-tazobactam  (ZOSYN) IVPB 3.375 g  Status:  Discontinued        3.375 g 12.5 mL/hr over 240 Minutes Intravenous 3 times per day 10/30/12 1018 10/30/12 1032   10/29/12 1500   ciprofloxacin (CIPRO) IVPB 400 mg  Status:  Discontinued        400 mg 200 mL/hr over 60 Minutes Intravenous Every 12 hours 10/29/12 1445 10/30/12 1017   10/29/12 1500   metroNIDAZOLE (FLAGYL) IVPB 500 mg  Status:  Discontinued        500 mg 100 mL/hr over 60 Minutes Intravenous Every 8 hours 10/29/12 1445 10/30/12 1017           Assessment/Plan  1. Epiploic appendagitis  Plan: 1.  Apparently there is a CT scan that has been ordered for today.  Will review these results.  Hopefully, inflammation is continuing to improve.   2. Cont symptomatic treatment for now.  Patient is otherwise AF with a normal WBC.  She is resting in bed more comfortably than I would expect for someone who continues to complain of as much pain as she does.  Will follow and see what CT scan shows.    LOS: 5 days  Otie Headlee E 11/03/2012, 8:09 AM Pager: [phone]

## 2012-11-03 NOTE — Progress Notes (Signed)
Try scheduled non-narcotic pain & nausea control Bowel regimen  The patient is stable.  There is no evidence of peritonitis, acute abdomen, nor shock.  There is no strong evidence of failure of improvement nor decline with current non-operative management.  There is no need for surgery at the present moment.  CT scan shows improvement.  No evide of SBO or constipationWe will continue to follow.

## 2012-11-03 NOTE — Progress Notes (Signed)
[facility]                                                                                Patient Demographics  Jasmine Roberson, is a 41 y.o. female  [id number]  [id number]  DOB - August 26, 1971  Admit date - 10/29/2012  Admitting Physician Kathlen Mody, MD  Outpatient Primary MD for the patient is Ron Parker, MD  LOS - 5   No chief complaint on file.       Assessment & Plan    Principal Problem:  *Epiploic appendagitis Active Problems:  Nausea & vomiting  Hypertension  Abdominal pain  Obesity, Class III, BMI 40-49.9 (morbid obesity)  Brief narrative:   This is a 41 yo female who developed periumbilical abdominal pain around 3 weeks again. It began radiating to her LLQ. She initially had no nausea or vomiting. She does admit to some diarrhea. She was evaluated and sent for a CT scan that questioned diverticulitis. She was started as an outpatient on Cipro and Flagyl. She developed nausea and vomiting secondary to her antibiotics. She went back and saw her PCP who direct admitted her for further care. She denies any fevers or chills. She has had a CT scan during this admission which reveals epiploic appendagitis with improved stranding noted compared to prior CT scan. Due to continued pain, we have been asked to see for further recommendations.     left lower quadrant acute epiploic appendagitis/patient has already had 2 CAT scans done in the last 2 weeks ,Initially suspected to have diverticulitis, Surgery following, on IV imipenem getting repeat CT scan on 11/03/2012, discussed the case with surgery PA Tresa Endo bedside, they will follow patient closely. For now IV fluids, clear liquid diet and supportive care to be continued along with empiric antibiotic.     Underlying history of orbital obesity.  BMI is above 40, and counseled on diet outpatient followup with PCP.     Hypertension  Blood pressure is slightly on the higher side, we  will add Norvasc we'll continue to monitor and adjust medications as needed.     Code Status: Full  Family Communication: Discussed with the patient  Disposition Plan: Home    Procedures CT scan of abdomen and pelvis   Consults  G.surgery   Time Spent in minutes   35   Antibiotics    Anti-infectives     Start     Dose/Rate Route Frequency Ordered Stop   10/30/12 1100   imipenem-cilastatin (PRIMAXIN) 500 mg in sodium chloride 0.9 % 100 mL IVPB        500 mg 200 mL/hr over 30 Minutes Intravenous 4 times per day 10/30/12 1036     10/30/12 1030   piperacillin-tazobactam (ZOSYN) IVPB 3.375 g  Status:  Discontinued        3.375 g 12.5 mL/hr over 240 Minutes Intravenous 3 times per day 10/30/12 1018 10/30/12 1032   10/29/12 1500   ciprofloxacin (CIPRO) IVPB 400 mg  Status:  Discontinued        400 mg 200 mL/hr over 60 Minutes Intravenous Every 12 hours 10/29/12 1445 10/30/12 1017   10/29/12 1500   metroNIDAZOLE (FLAGYL) IVPB  500 mg  Status:  Discontinued        500 mg 100 mL/hr over 60 Minutes Intravenous Every 8 hours 10/29/12 1445 10/30/12 1017          Scheduled Meds:   . enoxaparin (LOVENOX) injection  70 mg Subcutaneous Q24H  . imipenem-cilastatin  500 mg Intravenous Q6H  . [EXPIRED] iohexol  20 mL Oral Q1 Hr x 2  . lip balm   Topical BID  . pantoprazole (PROTONIX) IV  40 mg Intravenous Q24H  . [COMPLETED] promethazine  12.5 mg Intravenous Q6H  . psyllium  1 packet Oral BID  . [DISCONTINUED] ibuprofen  800 mg Oral QID  . [DISCONTINUED] naproxen  500 mg Oral TID WC   Continuous Infusions:   . sodium chloride 50 mL/hr at 11/03/12 0607   PRN Meds:.acetaminophen, acetaminophen, alum & mag hydroxide-simeth, diphenhydrAMINE, fentaNYL, magic mouthwash, magnesium hydroxide, metoCLOPramide (REGLAN) injection, promethazine, traMADol, [DISCONTINUED]  HYDROmorphone (DILAUDID) injection, [DISCONTINUED] oxyCODONE, [DISCONTINUED] promethazine   DVT Prophylaxis   Lovenox    Lab Results  Component Value Date   PLT 281 11/02/2012      Susa Raring K M.D on 11/03/2012 at 9:40 AM  Between 7am to 7pm - Pager - [phone]  After 7pm go to [email or web address] - password [email or web address]  And look for the night coverage person covering for me after hours  [facility] Office  [phone]    Subjective:   Adrijana Naik today has, No headache, No chest pain, am left lower cord and abdominal pain and nausea, No new weakness tingling or numbness, No Cough - SOB.   Objective:   Filed Vitals:   11/02/12 0615 11/02/12 1445 11/02/12 2127 11/03/12 0534  BP: 149/79 153/88 167/88 153/81  Pulse: 70 76 75 62  Temp: 98.4 F (36.9 C) 98 F (36.7 C) 98.3 F (36.8 C) 97.9 F (36.6 C)  TempSrc:  Oral Oral Oral  Resp: 18 18 19 15   Height:      Weight:      SpO2: 100% 100% 100% 99%    Wt Readings from Last 3 Encounters:  10/29/12 145.151 kg (320 lb)     Intake/Output Summary (Last 24 hours) at 11/03/12 0940 Last data filed at 11/03/12 0930  Gross per 24 hour  Intake    600 ml  Output    300 ml  Net    300 ml    Exam Awake Alert, Oriented X 3, No new F.N deficits, Normal affect [place].AT,PERRAL Supple Neck,No JVD, No cervical lymphadenopathy appriciated.  Symmetrical Chest wall movement, Good air movement bilaterally, CTAB RRR,No Gallops,Rubs or new Murmurs, No Parasternal Heave +ve B.Sounds, Abd Soft, mild left lower quadrant tenderness, No organomegaly appriciated, No rebound - guarding or rigidity. No Cyanosis, Clubbing or edema, No new Rash or bruise      Data Review   Micro Results No results found for this or any previous visit (from the past 240 hour(s)).  Radiology Reports Ct Abdomen Pelvis W Contrast  10/29/2012  *RADIOLOGY REPORT*  Clinical Data: Abdominal pain left lower quadrant pain, evaluate for abscess  CT ABDOMEN AND PELVIS WITH CONTRAST  Technique:  Multidetector CT imaging of the abdomen and pelvis was performed  following the standard protocol during bolus administration of intravenous contrast.  Contrast: OMNIPAQUE IOHEXOL 300 MG/ML  SOLN  Comparison: 10/22/2012  Findings: Lung bases clear.  Minimal atelectasis.  Normal heart size.  No pericardial or pleural effusion.  Abdomen:  Prior cholecystectomy noted.  Stable right hepatic 5  mm focal hypodensity, too small to definitively characterize but suspect small hepatic cyst, image 19.  No biliary dilatation. Hepatic and portal veins are patent.  Biliary system, pancreas, spleen, adrenal glands, and kidneys demonstrate no acute finding and are within normal limits for age.  Negative for bowel obstruction, dilatation, ileus or free air.  No abdominal free fluid, fluid collection, hemorrhage, adenopathy, or abscess.  Pelvis:  Contrast opacifies the normal appendix.  Calcified right gonadal vein phlebolith noted, image 59 which is stable.  Terminal ileum is unremarkable.  In the left lower quadrant, there is improvement in the pericolonic focal inflammatory process centered in the pericolonic fat on image 69.  Adjacent colon does not demonstrated significant wall thickening or diverticular change.  The appearance compared to the prior study is more consistent with resolving acute epiploic appendagitis rather than diverticulitis.  No associate obstruction pattern or pelvic fluid collection.  Negative for abscess.  Prior hysterectomy noted.  No pelvic free fluid, fluid collection, hemorrhage, adenopathy, inguinal abnormality, hernia.  Urinary bladder unremarkable.  No acute abnormal osseous finding.  IMPRESSION: Resolving left lower quadrant acute epiploic appendagitis. Negative for abscess.   Original Report Authenticated By: Judie Petit. Ruel Favors, M.D.    Ct Abdomen Pelvis W Contrast  10/22/2012  *RADIOLOGY REPORT*  Clinical Data: Left lower quadrant pain.  Question diverticulitis?  CT ABDOMEN AND PELVIS WITH CONTRAST  Technique:  Multidetector CT imaging of the abdomen and  pelvis was performed following the standard protocol during bolus administration of intravenous contrast.  Contrast: OMNIPAQUE IOHEXOL 300 MG/ML  SOLN  Comparison: None.  Findings: Extraluminal inflammation at the junction of the descending colon and sigmoid colon most consistent with changes of diverticulitis without drainable abscess or free intraperitoneal air.  Minimal basilar atelectasis.  Cardiomegaly.  No abdominal aortic aneurysm.  Post cholecystectomy.  Focal fatty infiltration within the liver adjacent the falciform ligament.  Right lobe of liver 5 mm low density lesion too small to be characterized as a simple cyst.  No focal splenic, pancreatic, renal or adrenal lesion.  Appearance of prior hysterectomy. Right ovary larger than the left containing small cysts/follicles.  Noncontrast filled views of the urinary bladder unremarkable.  No bony destructive lesion.  Scattered normal sized lymph nodes without adenopathy.  Mild diastasis rectus muscle without discrete bowel containing hernia.  IMPRESSION: Extraluminal inflammation at the junction of the descending colon and sigmoid colon most consistent with changes of diverticulitis without drainable abscess or free intraperitoneal air.  Please see above.  This has been made a PRA call report utilizing dashboard call feature.   Original Report Authenticated By: Fuller Canada, M.D.     CBC  Lab 11/02/12 0630 11/01/12 0645 10/31/12 1254 10/29/12 1401  WBC 6.6 6.9 6.6 6.9  HGB 11.6* 11.6* 11.9* 12.9  HCT 35.4* 35.8* 35.8* 37.7  PLT 281 299 284 308  MCV 83.7 84.2 83.3 81.6  MCH 27.4 27.3 27.7 27.9  MCHC 32.8 32.4 33.2 34.2  RDW 13.5 13.8 13.6 13.6  LYMPHSABS -- -- -- 3.1  MONOABS -- -- -- 0.4  EOSABS -- -- -- 0.2  BASOSABS -- -- -- 0.0  BANDABS -- -- -- --    Chemistries   Lab 11/01/12 0645 10/29/12 1401  NA 141 141  K 3.6 3.8  CL 108 107  CO2 28 28  GLUCOSE 87 95  BUN 6 7  CREATININE 0.85 0.75  CALCIUM 8.5 9.5  MG -- 2.0    AST 20 15  ALT  13 13  ALKPHOS 73 92  BILITOT 0.4 0.3   ------------------------------------------------------------------------------------------------------------------ estimated creatinine clearance is 131.2 ml/min (by C-G formula based on Cr of 0.85). ------------------------------------------------------------------------------------------------------------------ No results found for this basename: HGBA1C:2 in the last 72 hours ------------------------------------------------------------------------------------------------------------------ No results found for this basename: CHOL:2,HDL:2,LDLCALC:2,TRIG:2,CHOLHDL:2,LDLDIRECT:2 in the last 72 hours ------------------------------------------------------------------------------------------------------------------ No results found for this basename: TSH,T4TOTAL,FREET3,T3FREE,THYROIDAB in the last 72 hours ------------------------------------------------------------------------------------------------------------------ No results found for this basename: VITAMINB12:2,FOLATE:2,FERRITIN:2,TIBC:2,IRON:2,RETICCTPCT:2 in the last 72 hours  Coagulation profile  Lab 10/29/12 1401  INR 0.94  PROTIME --    No results found for this basename: DDIMER:2 in the last 72 hours  Cardiac Enzymes No results found for this basename: CK:3,CKMB:3,TROPONINI:3,MYOGLOBIN:3 in the last 168 hours ------------------------------------------------------------------------------------------------------------------ No components found with this basename: POCBNP:3

## 2012-11-04 LAB — CBC
MCH: 27.7 pg (ref 26.0–34.0)
Platelets: 326 10*3/uL (ref 150–400)
RBC: 4.7 MIL/uL (ref 3.87–5.11)
RDW: 13.4 % (ref 11.5–15.5)

## 2012-11-04 LAB — COMPREHENSIVE METABOLIC PANEL
ALT: 20 U/L (ref 0–35)
AST: 33 U/L (ref 0–37)
Albumin: 3.1 g/dL — ABNORMAL LOW (ref 3.5–5.2)
CO2: 26 mEq/L (ref 19–32)
Calcium: 8.9 mg/dL (ref 8.4–10.5)
GFR calc non Af Amer: >90 mL/min (ref >90)
Sodium: 137 mEq/L (ref 135–145)
Total Protein: 6.8 g/dL (ref 6.0–8.3)

## 2012-11-04 MED ORDER — POTASSIUM CHLORIDE CRYS ER 20 MEQ PO TBCR
40.0000 meq | EXTENDED_RELEASE_TABLET | Freq: Once | ORAL | Status: AC
  Administered 2012-11-04: 40 meq via ORAL
  Filled 2012-11-04: qty 2

## 2012-11-04 MED ORDER — PANTOPRAZOLE SODIUM 40 MG PO TBEC
40.0000 mg | DELAYED_RELEASE_TABLET | Freq: Every day | ORAL | Status: DC
  Administered 2012-11-05: 40 mg via ORAL
  Filled 2012-11-04: qty 1

## 2012-11-04 MED ORDER — POTASSIUM CHLORIDE IN NACL 20-0.9 MEQ/L-% IV SOLN
INTRAVENOUS | Status: AC
  Administered 2012-11-04: 1000 mL via INTRAVENOUS
  Administered 2012-11-05: 05:00:00 via INTRAVENOUS
  Filled 2012-11-04 (×2): qty 1000

## 2012-11-04 NOTE — Progress Notes (Addendum)
[facility]                                                                                Patient Demographics  Jasmine Roberson, is a 41 y.o. female  [id number]  [id number]  DOB - 09-Aug-1971  Admit date - 10/29/2012  Admitting Physician Kathlen Mody, MD  Outpatient Primary MD for the patient is Ron Parker, MD  LOS - 6   No chief complaint on file.       Assessment & Plan    Principal Problem:  *Epiploic appendagitis Active Problems:  Nausea & vomiting  Hypertension  Abdominal pain  Obesity, Class III, BMI 40-49.9 (morbid obesity)  Brief narrative:   This is a 41 yo female who developed periumbilical abdominal pain around 3 weeks again. It began radiating to her LLQ. She initially had no nausea or vomiting. She does admit to some diarrhea. She was evaluated and sent for a CT scan that questioned diverticulitis. She was started as an outpatient on Cipro and Flagyl. She developed nausea and vomiting secondary to her antibiotics. She went back and saw her PCP who direct admitted her for further care. She denies any fevers or chills. She has had a CT scan during this admission which reveals epiploic appendagitis with improved stranding noted compared to prior CT scan. Due to continued pain, we have been asked to see for further recommendations.     left lower quadrant acute epiploic appendagitis/patient has already had 2 CAT scans done in the last 2 weeks ,Initially suspected to have diverticulitis, Surgery following, on IV imipenem , improved repeat CT scan on 11/03/2012, discussed the case with surgery PA Tresa Endo bedside, they will follow patient closely. For now IV fluids, advance to full liquid diet and supportive care to be continued along with empiric antibiotic. Improved.     Underlying history of orbital obesity.  BMI is above 40, and counseled on diet outpatient followup with PCP.     Hypertension  Blood pressure is slightly  on the higher side, better on Norvasc, we'll continue to monitor and adjust medications as needed.    Low K  Replaced, recheck in am.     Code Status: Full  Family Communication: Discussed with the patient  Disposition Plan: Home    Procedures CT scan of abdomen and pelvis   Consults  G.surgery   Time Spent in minutes   35   Antibiotics    Anti-infectives     Start     Dose/Rate Route Frequency Ordered Stop   10/30/12 1100   imipenem-cilastatin (PRIMAXIN) 500 mg in sodium chloride 0.9 % 100 mL IVPB        500 mg 200 mL/hr over 30 Minutes Intravenous 4 times per day 10/30/12 1036     10/30/12 1030   piperacillin-tazobactam (ZOSYN) IVPB 3.375 g  Status:  Discontinued        3.375 g 12.5 mL/hr over 240 Minutes Intravenous 3 times per day 10/30/12 1018 10/30/12 1032   10/29/12 1500   ciprofloxacin (CIPRO) IVPB 400 mg  Status:  Discontinued        400 mg 200 mL/hr over 60 Minutes Intravenous Every 12 hours  10/29/12 1445 10/30/12 1017   10/29/12 1500   metroNIDAZOLE (FLAGYL) IVPB 500 mg  Status:  Discontinued        500 mg 100 mL/hr over 60 Minutes Intravenous Every 8 hours 10/29/12 1445 10/30/12 1017          Scheduled Meds:    . acetaminophen  1,000 mg Oral TID  . amLODipine  10 mg Oral Daily  . enoxaparin (LOVENOX) injection  70 mg Subcutaneous Q24H  . imipenem-cilastatin  500 mg Intravenous Q6H  . lip balm   Topical BID  . pantoprazole (PROTONIX) IV  40 mg Intravenous Q24H  . potassium chloride  40 mEq Oral Once  . promethazine  12.5 mg Oral TID AC  . psyllium  1 packet Oral BID  . traMADol  50 mg Oral Q6H   Continuous Infusions:    . 0.9 % NaCl with KCl 20 mEq / L    . [DISCONTINUED] sodium chloride 75 mL/hr at 11/03/12 1224   PRN Meds:.alum & mag hydroxide-simeth, diphenhydrAMINE, fentaNYL, [COMPLETED] iohexol, magic mouthwash, magnesium hydroxide, metoCLOPramide (REGLAN) injection, promethazine, traMADol, [DISCONTINUED] acetaminophen,  [DISCONTINUED] acetaminophen, [DISCONTINUED] traMADol   DVT Prophylaxis  Lovenox    Lab Results  Component Value Date   PLT 326 11/04/2012      Susa Raring K M.D on 11/04/2012 at 10:08 AM  Between 7am to 7pm - Pager - [phone]  After 7pm go to [email or web address] - password [email or web address]  And look for the night coverage person covering for me after hours  [facility] Office  [phone]    Subjective:   Jasmine Roberson today has, No headache, No chest pain, am left lower cord and abdominal pain and nausea, No new weakness tingling or numbness, No Cough - SOB.   Objective:   Filed Vitals:   11/02/12 2127 11/03/12 0534 11/03/12 2136 11/04/12 0623  BP: 167/88 153/81 139/78 145/81  Pulse: 75 62 68 69  Temp: 98.3 F (36.8 C) 97.9 F (36.6 C) 97.7 F (36.5 C) 98.1 F (36.7 C)  TempSrc: Oral Oral    Resp: 19 15 16 16   Height:      Weight:      SpO2: 100% 99% 100% 98%    Wt Readings from Last 3 Encounters:  10/29/12 145.151 kg (320 lb)     Intake/Output Summary (Last 24 hours) at 11/04/12 1008 Last data filed at 11/04/12 0541  Gross per 24 hour  Intake   1680 ml  Output    650 ml  Net   1030 ml    Exam Awake Alert, Oriented X 3, No new F.N deficits, Normal affect [place].AT,PERRAL Supple Neck,No JVD, No cervical lymphadenopathy appriciated.  Symmetrical Chest wall movement, Good air movement bilaterally, CTAB RRR,No Gallops,Rubs or new Murmurs, No Parasternal Heave +ve B.Sounds, Abd Soft, mild left lower quadrant tenderness, No organomegaly appriciated, No rebound - guarding or rigidity. No Cyanosis, Clubbing or edema, No new Rash or bruise      Data Review   Micro Results No results found for this or any previous visit (from the past 240 hour(s)).  Radiology Reports Ct Abdomen Pelvis W Contrast  10/29/2012  *RADIOLOGY REPORT*  Clinical Data: Abdominal pain left lower quadrant pain, evaluate for abscess  CT ABDOMEN AND PELVIS WITH CONTRAST   Technique:  Multidetector CT imaging of the abdomen and pelvis was performed following the standard protocol during bolus administration of intravenous contrast.  Contrast: OMNIPAQUE IOHEXOL 300 MG/ML  SOLN  Comparison: 10/22/2012  Findings: Lung  bases clear.  Minimal atelectasis.  Normal heart size.  No pericardial or pleural effusion.  Abdomen:  Prior cholecystectomy noted.  Stable right hepatic 5 mm focal hypodensity, too small to definitively characterize but suspect small hepatic cyst, image 19.  No biliary dilatation. Hepatic and portal veins are patent.  Biliary system, pancreas, spleen, adrenal glands, and kidneys demonstrate no acute finding and are within normal limits for age.  Negative for bowel obstruction, dilatation, ileus or free air.  No abdominal free fluid, fluid collection, hemorrhage, adenopathy, or abscess.  Pelvis:  Contrast opacifies the normal appendix.  Calcified right gonadal vein phlebolith noted, image 59 which is stable.  Terminal ileum is unremarkable.  In the left lower quadrant, there is improvement in the pericolonic focal inflammatory process centered in the pericolonic fat on image 69.  Adjacent colon does not demonstrated significant wall thickening or diverticular change.  The appearance compared to the prior study is more consistent with resolving acute epiploic appendagitis rather than diverticulitis.  No associate obstruction pattern or pelvic fluid collection.  Negative for abscess.  Prior hysterectomy noted.  No pelvic free fluid, fluid collection, hemorrhage, adenopathy, inguinal abnormality, hernia.  Urinary bladder unremarkable.  No acute abnormal osseous finding.  IMPRESSION: Resolving left lower quadrant acute epiploic appendagitis. Negative for abscess.   Original Report Authenticated By: Judie Petit. Ruel Favors, M.D.    Ct Abdomen Pelvis W Contrast  10/22/2012  *RADIOLOGY REPORT*  Clinical Data: Left lower quadrant pain.  Question diverticulitis?  CT ABDOMEN AND  PELVIS WITH CONTRAST  Technique:  Multidetector CT imaging of the abdomen and pelvis was performed following the standard protocol during bolus administration of intravenous contrast.  Contrast: OMNIPAQUE IOHEXOL 300 MG/ML  SOLN  Comparison: None.  Findings: Extraluminal inflammation at the junction of the descending colon and sigmoid colon most consistent with changes of diverticulitis without drainable abscess or free intraperitoneal air.  Minimal basilar atelectasis.  Cardiomegaly.  No abdominal aortic aneurysm.  Post cholecystectomy.  Focal fatty infiltration within the liver adjacent the falciform ligament.  Right lobe of liver 5 mm low density lesion too small to be characterized as a simple cyst.  No focal splenic, pancreatic, renal or adrenal lesion.  Appearance of prior hysterectomy. Right ovary larger than the left containing small cysts/follicles.  Noncontrast filled views of the urinary bladder unremarkable.  No bony destructive lesion.  Scattered normal sized lymph nodes without adenopathy.  Mild diastasis rectus muscle without discrete bowel containing hernia.  IMPRESSION: Extraluminal inflammation at the junction of the descending colon and sigmoid colon most consistent with changes of diverticulitis without drainable abscess or free intraperitoneal air.  Please see above.  This has been made a PRA call report utilizing dashboard call feature.   Original Report Authenticated By: Fuller Canada, M.D.     CBC  Lab 11/04/12 0535 11/02/12 0630 11/01/12 0645 10/31/12 1254 10/29/12 1401  WBC 5.4 6.6 6.9 6.6 6.9  HGB 13.0 11.6* 11.6* 11.9* 12.9  HCT 38.2 35.4* 35.8* 35.8* 37.7  PLT 326 281 299 284 308  MCV 81.3 83.7 84.2 83.3 81.6  MCH 27.7 27.4 27.3 27.7 27.9  MCHC 34.0 32.8 32.4 33.2 34.2  RDW 13.4 13.5 13.8 13.6 13.6  LYMPHSABS -- -- -- -- 3.1  MONOABS -- -- -- -- 0.4  EOSABS -- -- -- -- 0.2  BASOSABS -- -- -- -- 0.0  BANDABS -- -- -- -- --    Chemistries   Lab 11/04/12 0535  11/01/12 0645 10/29/12 1401  NA 137 141 141  K 3.1* 3.6 3.8  CL 102 108 107  CO2 26 28 28   GLUCOSE 93 87 95  BUN 4* 6 7  CREATININE 0.76 0.85 0.75  CALCIUM 8.9 8.5 9.5  MG 1.9 -- 2.0  AST 33 20 15  ALT 20 13 13   ALKPHOS 79 73 92  BILITOT 0.3 0.4 0.3   ------------------------------------------------------------------------------------------------------------------ estimated creatinine clearance is 139.4 ml/min (by C-G formula based on Cr of 0.76). ------------------------------------------------------------------------------------------------------------------ No results found for this basename: HGBA1C:2 in the last 72 hours ------------------------------------------------------------------------------------------------------------------ No results found for this basename: CHOL:2,HDL:2,LDLCALC:2,TRIG:2,CHOLHDL:2,LDLDIRECT:2 in the last 72 hours ------------------------------------------------------------------------------------------------------------------ No results found for this basename: TSH,T4TOTAL,FREET3,T3FREE,THYROIDAB in the last 72 hours ------------------------------------------------------------------------------------------------------------------ No results found for this basename: VITAMINB12:2,FOLATE:2,FERRITIN:2,TIBC:2,IRON:2,RETICCTPCT:2 in the last 72 hours  Coagulation profile  Lab 10/29/12 1401  INR 0.94  PROTIME --    No results found for this basename: DDIMER:2 in the last 72 hours  Cardiac Enzymes No results found for this basename: CK:3,CKMB:3,TROPONINI:3,MYOGLOBIN:3 in the last 168 hours ------------------------------------------------------------------------------------------------------------------ No components found with this basename: POCBNP:3

## 2012-11-04 NOTE — Progress Notes (Signed)
Patient ID: Jasmine Roberson, female   DOB: 24-Mar-1971, 41 y.o.   MRN: [id number]    Subjective: Pt feels much better today.  Pain much less, tolerating clears without any nausea.  Wants more to eat.  Objective: Vital signs in last 24 hours: Temp:  [97.7 F (36.5 C)-98.1 F (36.7 C)] 98.1 F (36.7 C) (11/05 0623) Pulse Rate:  [68-69] 69  (11/05 0623) Resp:  [16] 16  (11/05 0623) BP: (139-145)/(78-81) 145/81 mmHg (11/05 0623) SpO2:  [98 %-100 %] 98 % (11/05 0623) Last BM Date: 11/02/12  Intake/Output from previous day: 11/04 0701 - 11/05 0700 In: 1680 [I.V.:1570; IV Piggyback:110] Out: 950 [Urine:950] Intake/Output this shift:    PE: Abd: soft, much less tender, +BS, ND Heart: regular Lungs: CTAB  Lab Results:   Basename 11/04/12 0535 11/02/12 0630  WBC 5.4 6.6  HGB 13.0 11.6*  HCT 38.2 35.4*  PLT 326 281   BMET  Basename 11/04/12 0535  NA 137  K 3.1*  CL 102  CO2 26  GLUCOSE 93  BUN 4*  CREATININE 0.76  CALCIUM 8.9   PT/INR No results found for this basename: LABPROT:2,INR:2 in the last 72 hours CMP     Component Value Date/Time   NA 137 11/04/2012 0535   K 3.1* 11/04/2012 0535   CL 102 11/04/2012 0535   CO2 26 11/04/2012 0535   GLUCOSE 93 11/04/2012 0535   BUN 4* 11/04/2012 0535   CREATININE 0.76 11/04/2012 0535   CALCIUM 8.9 11/04/2012 0535   PROT 6.8 11/04/2012 0535   ALBUMIN 3.1* 11/04/2012 0535   AST 33 11/04/2012 0535   ALT 20 11/04/2012 0535   ALKPHOS 79 11/04/2012 0535   BILITOT 0.3 11/04/2012 0535   GFRNONAA >90 11/04/2012 0535   GFRAA >90 11/04/2012 0535   Lipase  No results found for this basename: lipase       Studies/Results: Ct Abdomen Pelvis W Contrast  11/03/2012  *RADIOLOGY REPORT*  Clinical Data: Abdominal pain  CT ABDOMEN AND PELVIS WITH CONTRAST  Technique:  Multidetector CT imaging of the abdomen and pelvis was performed following the standard protocol during bolus administration of intravenous contrast.  Contrast: OMNIPAQUE  IOHEXOL 300 MG/ML  SOLN  Comparison: 10/29/2012  Findings: Dependent atelectasis at the lung bases.  Cardiomegaly.  Stable hypodensity in the right lobe of the liver.  Spleen, pancreas, adrenal glands, kidneys are stable.  Inflammation within the peritoneal fat anterior to the sigmoid colon has further improved compatible with resolving gastroepiploic appendagitis.  No abscess.  No extraluminal bowel gas.  No free fluid.  Normal appendix.  Bladder and adnexa are within normal limits.  Uterus is absent.  IMPRESSION: Resolving gastroepiploic appendagitis of the sigmoid colon.  No evidence of perforation or abscess.   Original Report Authenticated By: Jolaine Click, M.D.     Anti-infectives: Anti-infectives     Start     Dose/Rate Route Frequency Ordered Stop   10/30/12 1100   imipenem-cilastatin (PRIMAXIN) 500 mg in sodium chloride 0.9 % 100 mL IVPB        500 mg 200 mL/hr over 30 Minutes Intravenous 4 times per day 10/30/12 1036     10/30/12 1030   piperacillin-tazobactam (ZOSYN) IVPB 3.375 g  Status:  Discontinued        3.375 g 12.5 mL/hr over 240 Minutes Intravenous 3 times per day 10/30/12 1018 10/30/12 1032   10/29/12 1500   ciprofloxacin (CIPRO) IVPB 400 mg  Status:  Discontinued  400 mg 200 mL/hr over 60 Minutes Intravenous Every 12 hours 10/29/12 1445 10/30/12 1017   10/29/12 1500   metroNIDAZOLE (FLAGYL) IVPB 500 mg  Status:  Discontinued        500 mg 100 mL/hr over 60 Minutes Intravenous Every 8 hours 10/29/12 1445 10/30/12 1017           Assessment/Plan  1. Epiploic appendagitis, improving  Plan: 1. Advance to full liquids.  Patient feeling much better today.  No surgical indications.  Once patient tolerates a diet, she is stable for dc home.   LOS: 6 days    Corynn Solberg E 11/04/2012, 9:46 AM Pager: [phone]

## 2012-11-04 NOTE — Progress Notes (Signed)
Improving on pain & bowel regimen Try to mobilize more  The patient is stable.  There is no evidence of peritonitis, acute abdomen, nor shock.  There is no strong evidence of failure of improvement nor decline with current non-operative management.  There is no need for surgery at the present moment.  We will continue to follow.

## 2012-11-05 LAB — BASIC METABOLIC PANEL
BUN: 5 mg/dL — ABNORMAL LOW (ref 6–23)
Chloride: 105 mEq/L (ref 96–112)
GFR calc Af Amer: >90 mL/min (ref >90)
GFR calc non Af Amer: >90 mL/min (ref >90)
Glucose, Bld: 88 mg/dL (ref 70–99)
Potassium: 3.6 mEq/L (ref 3.5–5.1)
Sodium: 139 mEq/L (ref 135–145)

## 2012-11-05 LAB — GLUCOSE, CAPILLARY

## 2012-11-05 MED ORDER — CIPROFLOXACIN HCL 500 MG PO TABS: 500.0000 mg | ORAL_TABLET | Freq: Two times a day (BID) | ORAL | Status: DC

## 2012-11-05 MED ORDER — POLYETHYLENE GLYCOL 3350 17 G PO PACK
17.0000 g | PACK | Freq: Every day | ORAL | Status: DC | PRN
Start: 2012-11-05 — End: 2012-11-05
  Filled 2012-11-05: qty 1

## 2012-11-05 MED ORDER — METRONIDAZOLE 500 MG PO TABS: 500.0000 mg | ORAL_TABLET | Freq: Three times a day (TID) | ORAL | Status: DC

## 2012-11-05 NOTE — Discharge Summary (Signed)
[facility]                                                                                   Jasmine Roberson, is a 41 y.o. female  DOB 05-29-1971  MRN [id number].  Admission date:  10/29/2012  Discharge Date:  11/05/2012  Primary MD  Ron Parker, MD  Admitting Physician  Kathlen Mody, MD  Admission Diagnosis  Acute Diverticlulitis  Discharge Diagnosis     Principal Problem:  *Epiploic appendagitis Active Problems:  Nausea & vomiting  Hypertension  Abdominal pain  Obesity, Class III, BMI 40-49.9 (morbid obesity)    Past Medical History  Diagnosis Date  . HTN (hypertension)   . Diverticulitis   . GERD (gastroesophageal reflux disease)   . Headache   . Obesity, Class III, BMI 40-49.9 (morbid obesity) 11/01/2012    BMI 48 on 01Nov2013    Past Surgical History  Procedure Date  . Cesarean section   . Cholecystectomy   . Abdominal hysterectomy     partial  . Breast surgery     reduction       Discharge Diagnoses:   Principal Problem:  *Epiploic appendagitis Active Problems:  Nausea & vomiting  Hypertension  Abdominal pain  Obesity, Class III, BMI 40-49.9 (morbid obesity)    Discharge Condition: Stable   Diet recommendation: See Discharge Instructions below   Consults G.Surgery-Dr Michaell Cowing   History of present illness and  Hospital Course:  See H&P, Labs, Consult and Test reports for all details in brief, patient was admitted for LLQ Epiploic appendagitis, she did not require surgery, she was seen by general surgery, serial CT scans shows remarkable improvement, today she is completely symptom free and tolerating diet and wants to go home today. She says she will follow with general surgery as outpatient if needed. Was treated here with IV antibiotics and bowel rest initially now to full diet, will place her on antibiotics for 5 more days, outpatient general surgery and PCP followup.       Today   Subjective:    Jasmine Roberson today has no headache, no chest abdominal pain,no new weakness tingling or numbness, feels much better wants to go home today.    Objective:   Blood pressure 126/71, pulse 74, temperature 98.1 F (36.7 C), temperature source Oral, resp. rate 16, height 5\' 8"  (1.727 m), weight 145.151 kg (320 lb), SpO2 100.00%.   Intake/Output Summary (Last 24 hours) at 11/05/12 1122 Last data filed at 11/04/12 1427  Gross per 24 hour  Intake    616 ml  Output    400 ml  Net    216 ml    Exam Awake Alert, Oriented *3, No new F.N deficits, Normal affect [place].AT,PERRAL Supple Neck,No JVD, No cervical lymphadenopathy appriciated.  Symmetrical Chest wall movement, Good air movement bilaterally, CTAB RRR,No Gallops,Rubs or new Murmurs, No Parasternal Heave +ve B.Sounds, Abd Soft, Non tender, No organomegaly appriciated, No rebound -guarding or rigidity. No Cyanosis, Clubbing or edema, No new Rash or bruise  Data Review   Major procedures and Radiology Reports - PLEASE review detailed and final reports for all details in brief -    Ct  Abdomen Pelvis W Contrast  11/03/2012  *RADIOLOGY REPORT*  Clinical Data: Abdominal pain  CT ABDOMEN AND PELVIS WITH CONTRAST  Technique:  Multidetector CT imaging of the abdomen and pelvis was performed following the standard protocol during bolus administration of intravenous contrast.  Contrast: OMNIPAQUE IOHEXOL 300 MG/ML  SOLN  Comparison: 10/29/2012  Findings: Dependent atelectasis at the lung bases.  Cardiomegaly.  Stable hypodensity in the right lobe of the liver.  Spleen, pancreas, adrenal glands, kidneys are stable.  Inflammation within the peritoneal fat anterior to the sigmoid colon has further improved compatible with resolving gastroepiploic appendagitis.  No abscess.  No extraluminal bowel gas.  No free fluid.  Normal appendix.  Bladder and adnexa are within normal limits.  Uterus is absent.  IMPRESSION: Resolving gastroepiploic  appendagitis of the sigmoid colon.  No evidence of perforation or abscess.   Original Report Authenticated By: Jolaine Click, M.D.    Ct Abdomen Pelvis W Contrast  10/29/2012  *RADIOLOGY REPORT*  Clinical Data: Abdominal pain left lower quadrant pain, evaluate for abscess  CT ABDOMEN AND PELVIS WITH CONTRAST  Technique:  Multidetector CT imaging of the abdomen and pelvis was performed following the standard protocol during bolus administration of intravenous contrast.  Contrast: OMNIPAQUE IOHEXOL 300 MG/ML  SOLN  Comparison: 10/22/2012  Findings: Lung bases clear.  Minimal atelectasis.  Normal heart size.  No pericardial or pleural effusion.  Abdomen:  Prior cholecystectomy noted.  Stable right hepatic 5 mm focal hypodensity, too small to definitively characterize but suspect small hepatic cyst, image 19.  No biliary dilatation. Hepatic and portal veins are patent.  Biliary system, pancreas, spleen, adrenal glands, and kidneys demonstrate no acute finding and are within normal limits for age.  Negative for bowel obstruction, dilatation, ileus or free air.  No abdominal free fluid, fluid collection, hemorrhage, adenopathy, or abscess.  Pelvis:  Contrast opacifies the normal appendix.  Calcified right gonadal vein phlebolith noted, image 59 which is stable.  Terminal ileum is unremarkable.  In the left lower quadrant, there is improvement in the pericolonic focal inflammatory process centered in the pericolonic fat on image 69.  Adjacent colon does not demonstrated significant wall thickening or diverticular change.  The appearance compared to the prior study is more consistent with resolving acute epiploic appendagitis rather than diverticulitis.  No associate obstruction pattern or pelvic fluid collection.  Negative for abscess.  Prior hysterectomy noted.  No pelvic free fluid, fluid collection, hemorrhage, adenopathy, inguinal abnormality, hernia.  Urinary bladder unremarkable.  No acute abnormal osseous  finding.  IMPRESSION: Resolving left lower quadrant acute epiploic appendagitis. Negative for abscess.   Original Report Authenticated By: Judie Petit. Ruel Favors, M.D.    Ct Abdomen Pelvis W Contrast  10/22/2012  *RADIOLOGY REPORT*  Clinical Data: Left lower quadrant pain.  Question diverticulitis?  CT ABDOMEN AND PELVIS WITH CONTRAST  Technique:  Multidetector CT imaging of the abdomen and pelvis was performed following the standard protocol during bolus administration of intravenous contrast.  Contrast: OMNIPAQUE IOHEXOL 300 MG/ML  SOLN  Comparison: None.  Findings: Extraluminal inflammation at the junction of the descending colon and sigmoid colon most consistent with changes of diverticulitis without drainable abscess or free intraperitoneal air.  Minimal basilar atelectasis.  Cardiomegaly.  No abdominal aortic aneurysm.  Post cholecystectomy.  Focal fatty infiltration within the liver adjacent the falciform ligament.  Right lobe of liver 5 mm low density lesion too small to be characterized as a simple cyst.  No focal splenic, pancreatic,  renal or adrenal lesion.  Appearance of prior hysterectomy. Right ovary larger than the left containing small cysts/follicles.  Noncontrast filled views of the urinary bladder unremarkable.  No bony destructive lesion.  Scattered normal sized lymph nodes without adenopathy.  Mild diastasis rectus muscle without discrete bowel containing hernia.  IMPRESSION: Extraluminal inflammation at the junction of the descending colon and sigmoid colon most consistent with changes of diverticulitis without drainable abscess or free intraperitoneal air.  Please see above.  This has been made a PRA call report utilizing dashboard call feature.   Original Report Authenticated By: Fuller Canada, M.D.     Micro Results     CBC w Diff: Lab Results  Component Value Date   WBC 5.4 11/04/2012   HGB 13.0 11/04/2012   HCT 38.2 11/04/2012   PLT 326 11/04/2012   LYMPHOPCT 45 10/29/2012    MONOPCT 5 10/29/2012   EOSPCT 3 10/29/2012   BASOPCT 0 10/29/2012    CMP: Lab Results  Component Value Date   NA 139 11/05/2012   K 3.6 11/05/2012   CL 105 11/05/2012   CO2 24 11/05/2012   BUN 5* 11/05/2012   CREATININE 0.75 11/05/2012   PROT 6.8 11/04/2012   ALBUMIN 3.1* 11/04/2012   BILITOT 0.3 11/04/2012   ALKPHOS 79 11/04/2012   AST 33 11/04/2012   ALT 20 11/04/2012  .   Discharge Instructions     Follow with Primary MD Ron Parker, MD in 2 days   Get CBC, CMP, checked 2 days by Primary MD and again as instructed by your Primary MD.    Get Medicines reviewed and adjusted.  Please request your Prim.MD to go over all Hospital Tests and Procedure/Radiological results at the follow up, please get all Hospital records sent to your Prim MD by signing hospital release before you go home.  Activity: As tolerated with Full fall precautions use walker/cane & assistance as needed   Diet:  Heart Healthy  For Heart failure patients - Check your Weight same time everyday, if you gain over 2 pounds, or you develop in leg swelling, experience more shortness of breath or chest pain, call your Primary MD immediately. Follow Cardiac Low Salt Diet and 1.8 lit/day fluid restriction.  Disposition Home    If you experience worsening of your admission symptoms, develop shortness of breath, life threatening emergency, suicidal or homicidal thoughts you must seek medical attention immediately by calling 911 or calling your MD immediately  if symptoms less severe.  You Must read complete instructions/literature along with all the possible adverse reactions/side effects for all the Medicines you take and that have been prescribed to you. Take any new Medicines after you have completely understood and accpet all the possible adverse reactions/side effects.   Do not drive and provide baby sitting services if your were admitted for syncope or siezures until you have seen by Primary MD or a Neurologist  and advised to do so again.  Do not drive when taking Pain medications.    Do not take more than prescribed Pain, Sleep and Anxiety Medications  Special Instructions: If you have smoked or chewed Tobacco  in the last 2 yrs please stop smoking, stop any regular Alcohol  and or any Recreational drug use.  Wear Seat belts while driving.  Follow-up Information    Follow up with Ron Parker, MD. Schedule an appointment as soon as possible for a visit in 2 days.   Contact information:   [address] [place] [address] [phone]       Follow up with GROSS,STEVEN C., MD. Schedule an appointment as soon as possible for a visit in 1 week.   Contact information:   [address] [place] [address] [phone]            Discharge Medications     Medication List     As of 11/05/2012 11:22 AM    START taking these medications         ciprofloxacin 500 MG tablet   Commonly known as: CIPRO   Take 1 tablet (500 mg total) by mouth 2 (two) times daily.      metroNIDAZOLE 500 MG tablet   Commonly known as: FLAGYL   Take 1 tablet (500 mg total) by mouth 3 (three) times daily.      CONTINUE taking these medications         amLODipine 5 MG tablet   Commonly known as: NORVASC      esomeprazole 40 MG capsule   Commonly known as: NEXIUM      traZODone 50 MG tablet   Commonly known as: DESYREL      valsartan-hydrochlorothiazide 320-25 MG per tablet   Commonly known as: DIOVAN-HCT          Where to get your medications    These are the prescriptions that you need to pick up.   You may get these medications from any pharmacy.         ciprofloxacin 500 MG tablet   metroNIDAZOLE 500 MG tablet             Total Time in preparing paper work, data evaluation and todays exam - 35 minutes  Leroy Sea M.D on 11/05/2012 at 11:22 AM  [facility] Office  [phone]

## 2012-11-05 NOTE — Progress Notes (Signed)
Patient discharged to home with husband.  Discharge teaching completed including follow up care, medications and diet.  Verbalizes understanding with no further questions.  Vital signs stable, no complaints of pain or nausea.

## 2012-11-05 NOTE — Progress Notes (Addendum)
Subjective: Feeling better. LLQ pain less, not resolve. No bowel movement yesterday but had a good bowel movement the day before. States that's not unusual for her.  Objective: Vital signs in last 24 hours: Temp:  [97.7 F (36.5 C)-98.1 F (36.7 C)] 98.1 F (36.7 C) (11/06 0615) Pulse Rate:  [69-74] 74  (11/06 0615) Resp:  [16] 16  (11/06 0615) BP: (126-133)/(71-81) 126/71 mmHg (11/06 0615) SpO2:  [100 %] 100 % (11/06 0615) Last BM Date: 11/02/12  Intake/Output from previous day: 11/05 0701 - 11/06 0700 In: 616 [I.V.:616] Out: 700 [Urine:700] Intake/Output this shift:    General appearance: alert. No distress. Mental status normal. Morbidly obese. Cooperative. GI: obese. Not distended. Soft. Still somewhat tender left lower quadrant and, localized,no peritoneal signs. No mass.  Lab Results:   [facility] 11/04/12 0535  WBC 5.4  HGB 13.0  HCT 38.2  PLT 326   BMET  Basename 11/04/12 0535  NA 137  K 3.1*  CL 102  CO2 26  GLUCOSE 93  BUN 4*  CREATININE 0.76  CALCIUM 8.9   PT/INR No results found for this basename: LABPROT:2,INR:2 in the last 72 hours ABG No results found for this basename: PHART:2,PCO2:2,PO2:2,HCO3:2 in the last 72 hours  Studies/Results: Ct Abdomen Pelvis W Contrast  11/03/2012  *RADIOLOGY REPORT*  Clinical Data: Abdominal pain  CT ABDOMEN AND PELVIS WITH CONTRAST  Technique:  Multidetector CT imaging of the abdomen and pelvis was performed following the standard protocol during bolus administration of intravenous contrast.  Contrast: OMNIPAQUE IOHEXOL 300 MG/ML  SOLN  Comparison: 10/29/2012  Findings: Dependent atelectasis at the lung bases.  Cardiomegaly.  Stable hypodensity in the right lobe of the liver.  Spleen, pancreas, adrenal glands, kidneys are stable.  Inflammation within the peritoneal fat anterior to the sigmoid colon has further improved compatible with resolving gastroepiploic appendagitis.  No abscess.  No extraluminal bowel  gas.  No free fluid.  Normal appendix.  Bladder and adnexa are within normal limits.  Uterus is absent.  IMPRESSION: Resolving gastroepiploic appendagitis of the sigmoid colon.  No evidence of perforation or abscess.   Original Report Authenticated By: Jolaine Click, M.D.     Anti-infectives: Anti-infectives     Start     Dose/Rate Route Frequency Ordered Stop   10/30/12 1100  imipenem-cilastatin (PRIMAXIN) 500 mg in sodium chloride 0.9 % 100 mL IVPB       500 mg 200 mL/hr over 30 Minutes Intravenous 4 times per day 10/30/12 1036     10/30/12 1030   piperacillin-tazobactam (ZOSYN) IVPB 3.375 g  Status:  Discontinued        3.375 g 12.5 mL/hr over 240 Minutes Intravenous 3 times per day 10/30/12 1018 10/30/12 1032   10/29/12 1500   ciprofloxacin (CIPRO) IVPB 400 mg  Status:  Discontinued        400 mg 200 mL/hr over 60 Minutes Intravenous Every 12 hours 10/29/12 1445 10/30/12 1017   10/29/12 1500   metroNIDAZOLE (FLAGYL) IVPB 500 mg  Status:  Discontinued        500 mg 100 mL/hr over 60 Minutes Intravenous Every 8 hours 10/29/12 1445 10/30/12 1017          Assessment/Plan:  LLQ Epiploic appendagitis, resolving. No acute surgical needs.  Advance diet MiraLAX when necessary Possible discharge tomorrow if the pain continues to resolve.   LOS: 7 days    Jasmine Roberson, M.D., [facility] General and Minimally invasive Surgery Breast and  Colorectal Surgery Office:   [phone] Pager:   [phone]  11/05/2012

## 2013-05-21 ENCOUNTER — Other Ambulatory Visit (HOSPITAL_COMMUNITY): Payer: Self-pay | Admitting: Internal Medicine

## 2013-05-21 ENCOUNTER — Ambulatory Visit (HOSPITAL_COMMUNITY)
Admission: RE | Admit: 2013-05-21 | Discharge: 2013-05-21 | Disposition: A | Discharge status: Home / Self Care | Payer: BC Managed Care – PPO | Source: Ambulatory Visit | Attending: Internal Medicine | Admitting: Internal Medicine

## 2013-05-21 DIAGNOSIS — R52 Pain, unspecified: Secondary | ICD-10-CM

## 2013-05-21 DIAGNOSIS — M25559 Pain in unspecified hip: Secondary | ICD-10-CM | POA: Insufficient documentation

## 2014-01-24 ENCOUNTER — Emergency Department (HOSPITAL_COMMUNITY)
Admission: EM | Admit: 2014-01-24 | Discharge: 2014-01-24 | Disposition: A | Discharge status: Home / Self Care | Payer: BC Managed Care – PPO | Attending: Emergency Medicine | Admitting: Emergency Medicine

## 2014-01-24 ENCOUNTER — Encounter (HOSPITAL_COMMUNITY): Payer: Self-pay | Admitting: Emergency Medicine

## 2014-01-24 DIAGNOSIS — M79609 Pain in unspecified limb: Secondary | ICD-10-CM

## 2014-01-24 DIAGNOSIS — K219 Gastro-esophageal reflux disease without esophagitis: Secondary | ICD-10-CM | POA: Insufficient documentation

## 2014-01-24 DIAGNOSIS — Z86711 Personal history of pulmonary embolism: Secondary | ICD-10-CM | POA: Insufficient documentation

## 2014-01-24 DIAGNOSIS — L03115 Cellulitis of right lower limb: Secondary | ICD-10-CM

## 2014-01-24 DIAGNOSIS — E669 Obesity, unspecified: Secondary | ICD-10-CM | POA: Insufficient documentation

## 2014-01-24 DIAGNOSIS — Z9089 Acquired absence of other organs: Secondary | ICD-10-CM | POA: Insufficient documentation

## 2014-01-24 DIAGNOSIS — Z792 Long term (current) use of antibiotics: Secondary | ICD-10-CM | POA: Insufficient documentation

## 2014-01-24 DIAGNOSIS — L03119 Cellulitis of unspecified part of limb: Principal | ICD-10-CM

## 2014-01-24 DIAGNOSIS — I10 Essential (primary) hypertension: Secondary | ICD-10-CM | POA: Insufficient documentation

## 2014-01-24 DIAGNOSIS — Z86718 Personal history of other venous thrombosis and embolism: Secondary | ICD-10-CM | POA: Insufficient documentation

## 2014-01-24 DIAGNOSIS — M7989 Other specified soft tissue disorders: Secondary | ICD-10-CM

## 2014-01-24 DIAGNOSIS — Z880 Allergy status to penicillin: Secondary | ICD-10-CM | POA: Insufficient documentation

## 2014-01-24 DIAGNOSIS — L02419 Cutaneous abscess of limb, unspecified: Secondary | ICD-10-CM | POA: Insufficient documentation

## 2014-01-24 DIAGNOSIS — Z79899 Other long term (current) drug therapy: Secondary | ICD-10-CM | POA: Insufficient documentation

## 2014-01-24 HISTORY — DX: Acute embolism and thrombosis of unspecified deep veins of unspecified lower extremity: I82.409

## 2014-01-24 HISTORY — DX: Other pulmonary embolism without acute cor pulmonale: I26.99

## 2014-01-24 LAB — CBC
HEMATOCRIT: 39.3 % (ref 36.0–46.0)
HEMOGLOBIN: 13.3 g/dL (ref 12.0–15.0)
MCH: 27.9 pg (ref 26.0–34.0)
MCHC: 33.8 g/dL (ref 30.0–36.0)
MCV: 82.6 fL (ref 78.0–100.0)
Platelets: 327 10*3/uL (ref 150–400)
RBC: 4.76 MIL/uL (ref 3.87–5.11)
RDW: 13.4 % (ref 11.5–15.5)
WBC: 7.8 10*3/uL (ref 4.0–10.5)

## 2014-01-24 LAB — BASIC METABOLIC PANEL
BUN: 8 mg/dL (ref 6–23)
CALCIUM: 9.1 mg/dL (ref 8.4–10.5)
CO2: 27 mEq/L (ref 19–32)
Chloride: 102 mEq/L (ref 96–112)
Creatinine, Ser: 0.89 mg/dL (ref 0.50–1.10)
GFR calc Af Amer: >90 mL/min (ref >90)
GFR, EST NON AFRICAN AMERICAN: 78 mL/min — AB (ref >90)
GLUCOSE: 105 mg/dL — AB (ref 70–99)
Potassium: 3.6 mEq/L — ABNORMAL LOW (ref 3.7–5.3)
SODIUM: 140 meq/L (ref 137–147)

## 2014-01-24 MED ORDER — OXYCODONE-ACETAMINOPHEN 5-325 MG PO TABS
1.0000 | ORAL_TABLET | Freq: Once | ORAL | Status: AC
  Administered 2014-01-24: 1 via ORAL
  Filled 2014-01-24: qty 1

## 2014-01-24 MED ORDER — SULFAMETHOXAZOLE-TMP DS 800-160 MG PO TABS
1.0000 | ORAL_TABLET | Freq: Once | ORAL | Status: AC
  Administered 2014-01-24: 1 via ORAL
  Filled 2014-01-24: qty 1

## 2014-01-24 MED ORDER — OXYCODONE-ACETAMINOPHEN 5-325 MG PO TABS: 1.0000 | ORAL_TABLET | Freq: Four times a day (QID) | ORAL | Status: DC | PRN

## 2014-01-24 MED ORDER — SULFAMETHOXAZOLE-TMP DS 800-160 MG PO TABS: 1.0000 | ORAL_TABLET | Freq: Two times a day (BID) | ORAL | Status: DC

## 2014-01-24 NOTE — Discharge Instructions (Signed)
Return to the ED with any concerns including increased swelling, increased area of redness, fever/chills, chest pain, difficulty breathing, fainting, decreased level of alertness/lethargy, or any other alarming symptoms

## 2014-01-24 NOTE — ED Provider Notes (Signed)
CSN: [id number]     Arrival date & time 01/24/14  1615 History   First MD Initiated Contact with Patient 01/24/14 1656     Chief Complaint  Patient presents with  . Leg Pain  . Leg Swelling   (Consider location/radiation/quality/duration/timing/severity/associated sxs/prior Treatment) HPI Pt presenting with c/o pain and swelling to the posterior aspect of her right lower leg.  Symptoms have been present for several days.  Pain is constant, worse with standing up and walking.  No fever/chills.  No trauma or abrasion to area.  Pt has hx of DVT approx 6-7 years ago.  Denies chest pain.  Denies shortness of breath that is new or different  Occasionally is short of breath with exertion at her baseline.  There are no other associated systemic symptoms, there are no other alleviating or modifying factors.   Past Medical History  Diagnosis Date  . HTN (hypertension)   . Diverticulitis   . GERD (gastroesophageal reflux disease)   . Headache(784.0)   . Obesity, Class III, BMI 40-49.9 (morbid obesity) 11/01/2012    BMI 48 on 01Nov2013  . DVT (deep venous thrombosis)   . PE (pulmonary embolism)    Past Surgical History  Procedure Laterality Date  . Cesarean section    . Cholecystectomy    . Abdominal hysterectomy      partial  . Breast surgery      reduction   History reviewed. No pertinent family history. History  Substance Use Topics  . Smoking status: Never Smoker   . Smokeless tobacco: Never Used  . Alcohol Use: No   OB History   Grav Para Term Preterm Abortions TAB SAB Ect Mult Living   1 1  1      2      Review of Systems ROS reviewed and all otherwise negative except for mentioned in HPI  Allergies  Morphine and related; Penicillins; and Zofran  Home Medications   Current Outpatient Rx  Name  Route  Sig  Dispense  Refill  . cloNIDine (CATAPRES) 0.1 MG tablet   Oral   Take 0.1 mg by mouth daily.         Marland Kitchen esomeprazole (NEXIUM) 40 MG capsule   Oral   Take 40 mg by  mouth daily before breakfast.         . ibuprofen (ADVIL,MOTRIN) 200 MG tablet   Oral   Take 800 mg by mouth every 6 (six) hours as needed for mild pain.         . valsartan-hydrochlorothiazide (DIOVAN-HCT) 320-25 MG per tablet   Oral   Take 1 tablet by mouth daily.         Marland Kitchen amLODipine (NORVASC) 5 MG tablet   Oral   Take 5 mg by mouth daily.         . clindamycin (CLEOCIN) 300 MG capsule   Oral   Take 1 capsule (300 mg total) by mouth 4 (four) times daily. X 7 days   28 capsule   0   . oxyCODONE-acetaminophen (PERCOCET/ROXICET) 5-325 MG per tablet   Oral   Take 1-2 tablets by mouth every 6 (six) hours as needed for severe pain.   5 tablet   0    BP 168/95  Pulse 80  Temp(Src) 97.7 F (36.5 C) (Oral)  Resp 18  Ht 5\' 7"  (1.702 m)  Wt 318 lb 1 oz (144.272 kg)  BMI 49.80 kg/m2  SpO2 99% Vitals reviewed Physical Exam Physical Examination: General appearance -  alert, well appearing, and in no distress Mental status - alert, oriented to person, place, and time Eyes - no scleral icterus, no conjunctival injection Chest - clear to auscultation, no wheezes, rales or rhonchi, symmetric air entry Heart - normal rate, regular rhythm, normal S1, S2, no murmurs, rubs, clicks or gallops Abdomen - soft, nontender, nondistended, no masses or organomegaly Neurological - alert, oriented, normal speech, moving all extremities, sensation intact distally Musculoskeletal - no joint tenderness, deformity or swelling, area of erythema of posterior calf of right LE, approx 4cm x 2 cm with some associated skin darkening, no palpable cords, no ttp of popliteal fossa Extremities - peripheral pulses normal, no pedal edema, no clubbing or cyanosis Skin - normal coloration and turgor, no rashes  ED Course  Procedures (including critical care time) Labs Review Labs Reviewed  BASIC METABOLIC PANEL - Abnormal; Notable for the following:    Potassium 3.6 (*)    Glucose, Bld 105 (*)    GFR  calc non Af Amer 78 (*)    All other components within normal limits  CBC   Imaging Review No results found.  EKG Interpretation   None       MDM  1. Cellulitis of right lower extremity  Pt presenting with pain and area of swelling and redness of right LE- hx of blood clots in the past. No chest pain or shortness of breath.  DVT study negative for blood clot.  Will place on abx for mild cellulitis of this area.  Advised to f/u with PMD- will need repeat ultrasound if symptoms persist.  Discharged with strict return precautions.  Pt agreeable with plan.    Ethelda ChickMartha K Linker, MD 01/27/14 1140

## 2014-01-24 NOTE — Progress Notes (Signed)
Bilateral lower extremity venous duplex:  No obvious evidence of DVT, superficial thrombosis, or Baker's Cyst.  Technically difficult study due to the patient's body habitus and guarding secondary to the pain.   

## 2014-01-24 NOTE — ED Notes (Signed)
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

## 2014-01-26 ENCOUNTER — Encounter (HOSPITAL_COMMUNITY): Payer: Self-pay | Admitting: Emergency Medicine

## 2014-01-26 ENCOUNTER — Emergency Department (HOSPITAL_COMMUNITY)
Admission: EM | Admit: 2014-01-26 | Discharge: 2014-01-26 | Disposition: A | Discharge status: Home / Self Care | Payer: BC Managed Care – PPO | Attending: Emergency Medicine | Admitting: Emergency Medicine

## 2014-01-26 DIAGNOSIS — I10 Essential (primary) hypertension: Secondary | ICD-10-CM | POA: Insufficient documentation

## 2014-01-26 DIAGNOSIS — Z888 Allergy status to other drugs, medicaments and biological substances status: Secondary | ICD-10-CM | POA: Insufficient documentation

## 2014-01-26 DIAGNOSIS — Z86711 Personal history of pulmonary embolism: Secondary | ICD-10-CM | POA: Insufficient documentation

## 2014-01-26 DIAGNOSIS — Z885 Allergy status to narcotic agent status: Secondary | ICD-10-CM | POA: Insufficient documentation

## 2014-01-26 DIAGNOSIS — K219 Gastro-esophageal reflux disease without esophagitis: Secondary | ICD-10-CM | POA: Insufficient documentation

## 2014-01-26 DIAGNOSIS — M7989 Other specified soft tissue disorders: Secondary | ICD-10-CM

## 2014-01-26 DIAGNOSIS — Z86718 Personal history of other venous thrombosis and embolism: Secondary | ICD-10-CM | POA: Insufficient documentation

## 2014-01-26 DIAGNOSIS — L03119 Cellulitis of unspecified part of limb: Principal | ICD-10-CM

## 2014-01-26 DIAGNOSIS — L03115 Cellulitis of right lower limb: Secondary | ICD-10-CM

## 2014-01-26 DIAGNOSIS — L02419 Cutaneous abscess of limb, unspecified: Secondary | ICD-10-CM | POA: Insufficient documentation

## 2014-01-26 DIAGNOSIS — Z880 Allergy status to penicillin: Secondary | ICD-10-CM | POA: Insufficient documentation

## 2014-01-26 DIAGNOSIS — K5732 Diverticulitis of large intestine without perforation or abscess without bleeding: Secondary | ICD-10-CM | POA: Insufficient documentation

## 2014-01-26 DIAGNOSIS — Z79899 Other long term (current) drug therapy: Secondary | ICD-10-CM | POA: Insufficient documentation

## 2014-01-26 MED ORDER — CLINDAMYCIN HCL 300 MG PO CAPS
300.0000 mg | ORAL_CAPSULE | Freq: Once | ORAL | Status: AC
  Administered 2014-01-26: 300 mg via ORAL
  Filled 2014-01-26: qty 1

## 2014-01-26 MED ORDER — CLINDAMYCIN HCL 300 MG PO CAPS: 300.0000 mg | ORAL_CAPSULE | Freq: Four times a day (QID) | ORAL | Status: DC

## 2014-01-26 NOTE — Discharge Instructions (Signed)
Use heat on the affected area 3 times a day for 30-60 minutes. Elevate your right leg above your heart as much is possible.     Cellulitis Cellulitis is an infection of the skin and the tissue beneath it. The infected area is usually red and tender. Cellulitis occurs most often in the arms and lower legs.  CAUSES  Cellulitis is caused by bacteria that enter the skin through cracks or cuts in the skin. The most common types of bacteria that cause cellulitis are Staphylococcus and Streptococcus. SYMPTOMS   Redness and warmth.  Swelling.  Tenderness or pain.  Fever. DIAGNOSIS  Your caregiver can usually determine what is wrong based on a physical exam. Blood tests may also be done. TREATMENT  Treatment usually involves taking an antibiotic medicine. HOME CARE INSTRUCTIONS   Take your antibiotics as directed. Finish them even if you start to feel better.  Keep the infected arm or leg elevated to reduce swelling.  Apply a warm cloth to the affected area up to 4 times per day to relieve pain.  Only take over-the-counter or prescription medicines for pain, discomfort, or fever as directed by your caregiver.  Keep all follow-up appointments as directed by your caregiver. SEEK MEDICAL CARE IF:   You notice red streaks coming from the infected area.  Your red area gets larger or turns dark in color.  Your bone or joint underneath the infected area becomes painful after the skin has healed.  Your infection returns in the same area or another area.  You notice a swollen bump in the infected area.  You develop new symptoms. SEEK IMMEDIATE MEDICAL CARE IF:   You have a fever.  You feel very sleepy.  You develop vomiting or diarrhea.  You have a general ill feeling (malaise) with muscle aches and pains. MAKE SURE YOU:   Understand these instructions.  Will watch your condition.  Will get help right away if you are not doing well or get worse. Document Released:  09/26/2005 Document Revised: 06/17/2012 Document Reviewed: 03/03/2012 [facility] Patient Information 2014 [place], [place].

## 2014-01-26 NOTE — ED Notes (Signed)
Provider in room  

## 2014-01-26 NOTE — ED Provider Notes (Signed)
CSN: [id number]     Arrival date & time 01/26/14  1541 History   First MD Initiated Contact with Patient 01/26/14 2005     Chief Complaint  Patient presents with  . Leg Swelling   (Consider location/radiation/quality/duration/timing/severity/associated sxs/prior Treatment) The history is provided by the patient.   Jasmine Roberson is a 43 y.o. female who complains of right lower leg pain, for 3 days. That is worsening. She was here 3 days ago and diagnosed with cellulitis after she had a Doppler of the right leg. She denies fever, chills, nausea, vomiting, weakness, or dizziness. The pain in her right leg is worse with ambulation. She works as a Engineer, civil (consulting). She is taking the medicines as prescribed. There are no other known modifying factors.   Past Medical History  Diagnosis Date  . HTN (hypertension)   . Diverticulitis   . GERD (gastroesophageal reflux disease)   . Headache(784.0)   . Obesity, Class III, BMI 40-49.9 (morbid obesity) 11/01/2012    BMI 48 on 01Nov2013  . DVT (deep venous thrombosis)   . PE (pulmonary embolism)    Past Surgical History  Procedure Laterality Date  . Cesarean section    . Cholecystectomy    . Abdominal hysterectomy      partial  . Breast surgery      reduction   History reviewed. No pertinent family history. History  Substance Use Topics  . Smoking status: Never Smoker   . Smokeless tobacco: Never Used  . Alcohol Use: No   OB History   Grav Para Term Preterm Abortions TAB SAB Ect Mult Living   1 1  1      2      Review of Systems  All other systems reviewed and are negative.    Allergies  Morphine and related; Penicillins; and Zofran  Home Medications   Current Outpatient Rx  Name  Route  Sig  Dispense  Refill  . amLODipine (NORVASC) 5 MG tablet   Oral   Take 5 mg by mouth daily.         . cloNIDine (CATAPRES) 0.1 MG tablet   Oral   Take 0.1 mg by mouth daily.         Marland Kitchen esomeprazole (NEXIUM) 40 MG capsule   Oral   Take  40 mg by mouth daily before breakfast.         . ibuprofen (ADVIL,MOTRIN) 200 MG tablet   Oral   Take 800 mg by mouth every 6 (six) hours as needed for mild pain.         Marland Kitchen oxyCODONE-acetaminophen (PERCOCET/ROXICET) 5-325 MG per tablet   Oral   Take 1-2 tablets by mouth every 6 (six) hours as needed for severe pain.   5 tablet   0   . valsartan-hydrochlorothiazide (DIOVAN-HCT) 320-25 MG per tablet   Oral   Take 1 tablet by mouth daily.         . clindamycin (CLEOCIN) 300 MG capsule   Oral   Take 1 capsule (300 mg total) by mouth 4 (four) times daily. X 7 days   28 capsule   0    BP 157/80  Pulse 76  Temp(Src) 98.4 F (36.9 C) (Oral)  Resp 16  Ht 5\' 7"  (1.702 m)  Wt 318 lb (144.244 kg)  BMI 49.79 kg/m2  SpO2 95% Physical Exam  Nursing note and vitals reviewed. Constitutional: She is oriented to person, place, and time. She appears well-developed.  Obese  HENT:  Head:  Normocephalic and atraumatic.  Eyes: Conjunctivae and EOM are normal. Pupils are equal, round, and reactive to light.  Neck: Normal range of motion and phonation normal. Neck supple.  Cardiovascular: Normal rate and intact distal pulses.   Pulmonary/Chest: Effort normal.  Musculoskeletal:  Tenderness: Right lower leg, primarily medial, with mild, diffuse redness. No palpable abscess or fluctuance. No proximal streaking. No popliteal adenopathy.  Neurological: She is alert and oriented to person, place, and time. She exhibits normal muscle tone.  Skin: Skin is warm and dry.  Psychiatric: She has a normal mood and affect. Her behavior is normal. Judgment and thought content normal.    ED Course  Procedures (including critical care time)  Medications  clindamycin (CLEOCIN) capsule 300 mg (300 mg Oral Given 01/26/14 2042)    Patient Vitals for the past 24 hrs:  BP Temp Temp src Pulse Resp SpO2 Height Weight  01/26/14 2044 - - - 76 16 - - -  01/26/14 2000 157/80 mmHg - - - - - - -  01/26/14 1558  158/84 mmHg 98.4 F (36.9 C) Oral 75 16 95 % 5\' 7"  (1.702 m) 318 lb (144.244 kg)    8:24 PM Reevaluation with update and discussion. After initial assessment and treatment, an updated evaluation reveals findings discussed with the patient. All questions answered.Mancel Bale. Karesa Maultsby L      Labs Review Labs Reviewed - No data to display Imaging Review No results found.  EKG Interpretation   None       MDM   1. Cellulitis of right leg      Cellulitis, right lower leg, aggravated by her continued immature status. There is no indication for I&D, at this time. She will need more broad-spectrum antibiotic coverage for the infection. She's not had a history of diabetes. There is no evidence for serious bacterial infection or impending vascular collapse.   Nursing Notes Reviewed/ Care Coordinated Applicable Imaging Reviewed Interpretation of Laboratory Data incorporated into ED treatment  The patient appears reasonably screened and/or stabilized for discharge and I doubt any other medical condition or other [facility] requiring further screening, evaluation, or treatment in the ED at this time prior to discharge.  Plan: Home Medications- DC Septra. Start clindamycin; Home Treatments- rest, elevation, heat; return here if the recommended treatment, does not improve the symptoms; Recommended follow up- PCP or here if not better in 2 days   Flint MelterElliott L Demetrion Wesby, MD 01/26/14 2350

## 2014-01-26 NOTE — Progress Notes (Signed)
*  PRELIMINARY RESULTS* Vascular Ultrasound Right lower extremity venous duplex has been completed.  Preliminary findings: no evidence of DVT     Farrel DemarkJill Eunice, RDMS, RVT  01/26/2014, 5:13 PM

## 2014-01-26 NOTE — ED Notes (Signed)
Pt reports Right leg swelling and redness x2 days, pt seen here Sunday for the same, diagnosed with cellulitis and prescribed pain medication and an ABX. Pt's doppler was negative for blood clots on Sunday

## 2014-01-26 NOTE — ED Notes (Signed)
PA in fast track called to assess for need for dopplar.

## 2014-02-22 ENCOUNTER — Encounter (HOSPITAL_COMMUNITY): Payer: Self-pay | Admitting: Emergency Medicine

## 2014-02-22 DIAGNOSIS — Z791 Long term (current) use of non-steroidal anti-inflammatories (NSAID): Secondary | ICD-10-CM | POA: Insufficient documentation

## 2014-02-22 DIAGNOSIS — R21 Rash and other nonspecific skin eruption: Secondary | ICD-10-CM | POA: Insufficient documentation

## 2014-02-22 DIAGNOSIS — Z880 Allergy status to penicillin: Secondary | ICD-10-CM | POA: Insufficient documentation

## 2014-02-22 DIAGNOSIS — M7989 Other specified soft tissue disorders: Secondary | ICD-10-CM | POA: Insufficient documentation

## 2014-02-22 DIAGNOSIS — I10 Essential (primary) hypertension: Secondary | ICD-10-CM | POA: Insufficient documentation

## 2014-02-22 DIAGNOSIS — Z792 Long term (current) use of antibiotics: Secondary | ICD-10-CM | POA: Insufficient documentation

## 2014-02-22 DIAGNOSIS — K219 Gastro-esophageal reflux disease without esophagitis: Secondary | ICD-10-CM | POA: Insufficient documentation

## 2014-02-22 DIAGNOSIS — Z86718 Personal history of other venous thrombosis and embolism: Secondary | ICD-10-CM | POA: Insufficient documentation

## 2014-02-22 DIAGNOSIS — Z79899 Other long term (current) drug therapy: Secondary | ICD-10-CM | POA: Insufficient documentation

## 2014-02-22 DIAGNOSIS — Z86711 Personal history of pulmonary embolism: Secondary | ICD-10-CM | POA: Insufficient documentation

## 2014-02-22 LAB — CBC WITH DIFFERENTIAL/PLATELET
Basophils Absolute: 0 10*3/uL (ref 0.0–0.1)
Basophils Relative: 0 % (ref 0–1)
EOS PCT: 2 % (ref 0–5)
Eosinophils Absolute: 0.2 10*3/uL (ref 0.0–0.7)
HCT: 37.2 % (ref 36.0–46.0)
Hemoglobin: 12.5 g/dL (ref 12.0–15.0)
LYMPHS ABS: 3.2 10*3/uL (ref 0.7–4.0)
LYMPHS PCT: 36 % (ref 12–46)
MCH: 28.2 pg (ref 26.0–34.0)
MCHC: 33.6 g/dL (ref 30.0–36.0)
MCV: 83.8 fL (ref 78.0–100.0)
MONOS PCT: 5 % (ref 3–12)
Monocytes Absolute: 0.4 10*3/uL (ref 0.1–1.0)
Neutro Abs: 5.1 10*3/uL (ref 1.7–7.7)
Neutrophils Relative %: 57 % (ref 43–77)
PLATELETS: 318 10*3/uL (ref 150–400)
RBC: 4.44 MIL/uL (ref 3.87–5.11)
RDW: 14.2 % (ref 11.5–15.5)
WBC: 8.9 10*3/uL (ref 4.0–10.5)

## 2014-02-22 LAB — COMPREHENSIVE METABOLIC PANEL
ALT: 11 U/L (ref 0–35)
AST: 14 U/L (ref 0–37)
Albumin: 3.5 g/dL (ref 3.5–5.2)
Alkaline Phosphatase: 93 U/L (ref 39–117)
BILIRUBIN TOTAL: 0.3 mg/dL (ref 0.3–1.2)
BUN: 7 mg/dL (ref 6–23)
CHLORIDE: 102 meq/L (ref 96–112)
CO2: 29 meq/L (ref 19–32)
Calcium: 9.5 mg/dL (ref 8.4–10.5)
Creatinine, Ser: 0.87 mg/dL (ref 0.50–1.10)
GFR calc Af Amer: >90 mL/min (ref >90)
GFR calc non Af Amer: 80 mL/min — ABNORMAL LOW (ref >90)
Glucose, Bld: 101 mg/dL — ABNORMAL HIGH (ref 70–99)
POTASSIUM: 3.8 meq/L (ref 3.7–5.3)
Sodium: 142 mEq/L (ref 137–147)
Total Protein: 7.7 g/dL (ref 6.0–8.3)

## 2014-02-22 NOTE — ED Notes (Signed)
Patient presents with c/o right lower leg swelling.  Has been seen here 2 other times for the same thing.  Has had dopplers done but they were negative.  Has been treated for cellulitis with Bactrim and then Clindimycin.

## 2014-02-23 ENCOUNTER — Emergency Department (HOSPITAL_COMMUNITY): Payer: Self-pay

## 2014-02-23 ENCOUNTER — Emergency Department (HOSPITAL_COMMUNITY)
Admission: EM | Admit: 2014-02-23 | Discharge: 2014-02-23 | Disposition: A | Discharge status: Home / Self Care | Payer: Self-pay | Attending: Emergency Medicine | Admitting: Emergency Medicine

## 2014-02-23 ENCOUNTER — Encounter (HOSPITAL_COMMUNITY): Payer: Self-pay | Admitting: Radiology

## 2014-02-23 DIAGNOSIS — M7989 Other specified soft tissue disorders: Secondary | ICD-10-CM

## 2014-02-23 MED ORDER — HYDROMORPHONE HCL PF 1 MG/ML IJ SOLN
1.0000 mg | Freq: Once | INTRAMUSCULAR | Status: AC
  Administered 2014-02-23: 1 mg via INTRAVENOUS
  Filled 2014-02-23: qty 1

## 2014-02-23 MED ORDER — FENTANYL CITRATE 0.05 MG/ML IJ SOLN
50.0000 ug | Freq: Once | INTRAMUSCULAR | Status: AC
  Administered 2014-02-23: 50 ug via INTRAVENOUS
  Filled 2014-02-23: qty 2

## 2014-02-23 MED ORDER — IOHEXOL 300 MG/ML  SOLN
100.0000 mL | Freq: Once | INTRAMUSCULAR | Status: AC | PRN
  Administered 2014-02-23: 100 mL via INTRAVENOUS

## 2014-02-23 MED ORDER — DOXYCYCLINE HYCLATE 100 MG PO CAPS: 100.0000 mg | ORAL_CAPSULE | Freq: Two times a day (BID) | ORAL | Status: DC

## 2014-02-23 MED ORDER — HYDROCODONE-ACETAMINOPHEN 5-325 MG PO TABS: 2.0000 | ORAL_TABLET | ORAL | Status: DC | PRN

## 2014-02-23 MED ORDER — PROMETHAZINE HCL 25 MG/ML IJ SOLN
25.0000 mg | Freq: Once | INTRAMUSCULAR | Status: AC
  Administered 2014-02-23: 25 mg via INTRAMUSCULAR
  Filled 2014-02-23: qty 1

## 2014-02-23 MED ORDER — NAPROXEN 500 MG PO TABS: 500.0000 mg | ORAL_TABLET | Freq: Two times a day (BID) | ORAL | Status: DC

## 2014-02-23 NOTE — ED Notes (Signed)
Patient transported to CT 

## 2014-02-23 NOTE — ED Provider Notes (Signed)
CSN: [id number]     Arrival date & time 02/22/14  1915 History   First MD Initiated Contact with Patient 02/23/14 0122     Chief Complaint  Patient presents with  . RIght leg Swelling      (Consider location/radiation/quality/duration/timing/severity/associated sxs/prior Treatment) HPI Comments: 43 year old female with a history of hypertension as well as a history of a deep venous thrombosis. She presents with a complaint of right lower extremity swelling. Of note the patient was seen approximately 3 weeks ago on 2 different occasions and had Dopplers of her lower extremity on the right both showing no signs of DVT. She was given 2 different courses of antibiotics including Bactrim and clindamycin but had no significant clinical improvement. In fact she reports that the pain and swelling and redness has been persistent, gradually worsening though not associated with fevers chills nausea or vomiting. She denies any coughing, shortness of breath, denies injury to the leg, overuse of the leg, insect bites or rashes to the leg other than the redness that has been present for this time. She has not followed up with her family doctor as they have recently closed there office.  The history is provided by the patient, the spouse and medical records.    Past Medical History  Diagnosis Date  . HTN (hypertension)   . Diverticulitis   . GERD (gastroesophageal reflux disease)   . Headache(784.0)   . Obesity, Class III, BMI 40-49.9 (morbid obesity) 11/01/2012    BMI 48 on 01Nov2013  . DVT (deep venous thrombosis)   . PE (pulmonary embolism)    Past Surgical History  Procedure Laterality Date  . Cesarean section    . Cholecystectomy    . Abdominal hysterectomy      partial  . Breast surgery      reduction   History reviewed. No pertinent family history. History  Substance Use Topics  . Smoking status: Never Smoker   . Smokeless tobacco: Never Used  . Alcohol Use: No   OB History   Grav  Para Term Preterm Abortions TAB SAB Ect Mult Living   1 1  1      2      Review of Systems  All other systems reviewed and are negative.      Allergies  Morphine and related; Penicillins; and Zofran  Home Medications   Current Outpatient Rx  Name  Route  Sig  Dispense  Refill  . amLODipine (NORVASC) 5 MG tablet   Oral   Take 5 mg by mouth daily.         . cloNIDine (CATAPRES) 0.1 MG tablet   Oral   Take 0.1 mg by mouth daily.         Marland Kitchen. esomeprazole (NEXIUM) 40 MG capsule   Oral   Take 40 mg by mouth daily as needed (for acid reflux).          . traZODone (DESYREL) 50 MG tablet   Oral   Take 50 mg by mouth at bedtime as needed for sleep.         . valsartan-hydrochlorothiazide (DIOVAN-HCT) 320-25 MG per tablet   Oral   Take 1 tablet by mouth daily.         Marland Kitchen. doxycycline (VIBRAMYCIN) 100 MG capsule   Oral   Take 1 capsule (100 mg total) by mouth 2 (two) times daily.   20 capsule   0   . HYDROcodone-acetaminophen (NORCO/VICODIN) 5-325 MG per tablet   Oral  Take 2 tablets by mouth every 4 (four) hours as needed.   20 tablet   0   . naproxen (NAPROSYN) 500 MG tablet   Oral   Take 1 tablet (500 mg total) by mouth 2 (two) times daily with a meal.   30 tablet   0    BP 158/79  Pulse 67  Temp(Src) 98 F (36.7 C) (Oral)  Resp 20  Ht 5\' 7"  (1.702 m)  Wt 318 lb (144.244 kg)  BMI 49.79 kg/m2  SpO2 98% Physical Exam  Nursing note and vitals reviewed. Constitutional: She appears well-developed and well-nourished. No distress.  HENT:  Head: Normocephalic and atraumatic.  Mouth/Throat: Oropharynx is clear and moist. No oropharyngeal exudate.  Eyes: Conjunctivae and EOM are normal. Pupils are equal, round, and reactive to light. Right eye exhibits no discharge. Left eye exhibits no discharge. No scleral icterus.  Neck: Normal range of motion. Neck supple. No JVD present. No thyromegaly present.  Cardiovascular: Normal rate, regular rhythm, normal  heart sounds and intact distal pulses.  Exam reveals no gallop and no friction rub.   No murmur heard. Pulmonary/Chest: Effort normal and breath sounds normal. No respiratory distress. She has no wheezes. She has no rales.  Abdominal: Soft. Bowel sounds are normal. She exhibits no distension and no mass. There is no tenderness.  Musculoskeletal: Normal range of motion. She exhibits edema and tenderness.  Right lower extremity with tenderness redness and induration of the anterior and medial tissues, this is not circumferential, does not involve the posterior or lateral tissues. It extends approximately 10 inches down the anterior surface of the lower extremity. Joints are supple, other compartments are soft.  Lymphadenopathy:    She has no cervical adenopathy.  Neurological: She is alert. Coordination normal.  Skin: Skin is warm and dry. Rash noted. There is erythema.  Psychiatric: She has a normal mood and affect. Her behavior is normal.    ED Course  Procedures (including critical care time) Labs Review Labs Reviewed  COMPREHENSIVE METABOLIC PANEL - Abnormal; Notable for the following:    Glucose, Bld 101 (*)    GFR calc non Af Amer 80 (*)    All other components within normal limits  CBC WITH DIFFERENTIAL   Imaging Review Ct Tibia Fibula Right W Contrast  02/23/2014   CLINICAL DATA:  Swelling and erythema at the right lower leg, not improving with antibiotics.  EXAM: CT OF THE RIGHT TIBIA AND FIBULA WITH CONTRAST  TECHNIQUE: Multidetector CT imaging of the right tibia and fibula was performed following the standard protocol without intravenous contrast. Multiplanar CT image reconstructions were also generated.  CONTRAST:  OMNIPAQUE IOHEXOL 300 MG/ML  SOLN  COMPARISON:  Right hip radiographs performed 05/21/2013  FINDINGS: There is very mild soft tissue edema extending from the level of the right mid lower leg to the level of the ankle. Associated diffuse skin thickening is seen,  most prominent anteriorly and medially. No focal fluid collections are seen to suggest abscess. Scattered associated calcifications about the mid lower leg may reflect a prior procedure or prior injury.  The vasculature is not fully assessed without contrast, but appears grossly unremarkable. There is no evidence of involvement of the underlying musculature. The knee joint is incompletely assessed, but is grossly normal in appearance. Trace joint fluid at the right knee remains within normal limits. The ankle joint is grossly unremarkable in appearance.  No acute osseous abnormalities are identified. Plantar and posterior calcaneal spurs are incidentally seen.  IMPRESSION: Diffuse skin thickening about the right mid lower leg, most prominent anteriorly and medially. Very mild associated soft tissue edema extending from the level of the right mid lower leg to the level of the ankle. No fluid collections seen. Scattered associated soft tissue calcifications may reflect a prior procedure or prior injury.   Electronically Signed   By: Roanna Raider M.D.   On: 02/23/2014 02:38    EKG Interpretation   None       MDM   Final diagnoses:  Swelling of right lower extremity    The skin does appear abnormal, this could be consistent with a cellulitis however the patient is not febrile, has not responded to 2 different courses of medication and has no leukocytosis. There is a potential that the patient does have a deep tissue infection or abscess, I will order a CT scan with contrast to rule out deep tissue abscess, pain medications ordered. The patient appears hemodynamically stable.  Filed Vitals:   02/23/14 0315  BP: 158/79  Pulse: 67  Temp:   Resp:     Findings were reviewed with the radiologist Dr. Cherly Hensen, clinically the patient does not sound like she has cellulitis but due to the appearance of the skin and lack of an alternative diagnosis doxycycline was prescribed, I feel that she would benefit  from a biopsy, referred to orthopedics, patient informed of results and plan and is agreeable   Meds given in ED:  Medications  fentaNYL (SUBLIMAZE) injection 50 mcg (50 mcg Intravenous Given 02/23/14 0158)  iohexol (OMNIPAQUE) 300 MG/ML solution 100 mL (100 mLs Intravenous Contrast Given 02/23/14 0208)  HYDROmorphone (DILAUDID) injection 1 mg (1 mg Intravenous Given 02/23/14 0330)  promethazine (PHENERGAN) injection 25 mg (25 mg Intramuscular Given 02/23/14 0307)    New Prescriptions   DOXYCYCLINE (VIBRAMYCIN) 100 MG CAPSULE    Take 1 capsule (100 mg total) by mouth 2 (two) times daily.   HYDROCODONE-ACETAMINOPHEN (NORCO/VICODIN) 5-325 MG PER TABLET    Take 2 tablets by mouth every 4 (four) hours as needed.   NAPROXEN (NAPROSYN) 500 MG TABLET    Take 1 tablet (500 mg total) by mouth 2 (two) times daily with a meal.       Vida Roller, MD 02/23/14 [phone]

## 2014-02-23 NOTE — Discharge Instructions (Signed)
Your testing shows no signs of deep tissue infection - use the doxycycline twice daily for 10 days, see the Orthopedist of your choice or Dr. Ave Filterhandler who is on call today - he may want to perform a skin biopsy.  Use compression stockings until you see him in the office.  Please call your doctor for a followup appointment within 24-48 hours. When you talk to your doctor please let them know that you were seen in the emergency department and have them acquire all of your records so that they can discuss the findings with you and formulate a treatment plan to fully care for your new and ongoing problems.

## 2014-11-01 ENCOUNTER — Encounter (HOSPITAL_COMMUNITY): Payer: Self-pay | Admitting: Radiology

## 2015-05-30 ENCOUNTER — Encounter (HOSPITAL_COMMUNITY): Payer: Self-pay | Admitting: *Deleted

## 2015-05-30 ENCOUNTER — Emergency Department (HOSPITAL_COMMUNITY): Payer: Managed Care, Other (non HMO)

## 2015-05-30 ENCOUNTER — Observation Stay (HOSPITAL_COMMUNITY)
Admission: EM | Admit: 2015-05-30 | Discharge: 2015-05-31 | Disposition: A | Discharge status: Home / Self Care | Payer: Managed Care, Other (non HMO) | Attending: Internal Medicine | Admitting: Internal Medicine

## 2015-05-30 DIAGNOSIS — I10 Essential (primary) hypertension: Secondary | ICD-10-CM | POA: Diagnosis not present

## 2015-05-30 DIAGNOSIS — R0602 Shortness of breath: Secondary | ICD-10-CM | POA: Insufficient documentation

## 2015-05-30 DIAGNOSIS — Z86711 Personal history of pulmonary embolism: Secondary | ICD-10-CM | POA: Diagnosis not present

## 2015-05-30 DIAGNOSIS — R42 Dizziness and giddiness: Secondary | ICD-10-CM | POA: Insufficient documentation

## 2015-05-30 DIAGNOSIS — R079 Chest pain, unspecified: Secondary | ICD-10-CM | POA: Diagnosis not present

## 2015-05-30 DIAGNOSIS — Z86718 Personal history of other venous thrombosis and embolism: Secondary | ICD-10-CM | POA: Insufficient documentation

## 2015-05-30 DIAGNOSIS — K219 Gastro-esophageal reflux disease without esophagitis: Secondary | ICD-10-CM | POA: Insufficient documentation

## 2015-05-30 DIAGNOSIS — Z880 Allergy status to penicillin: Secondary | ICD-10-CM | POA: Diagnosis not present

## 2015-05-30 DIAGNOSIS — Z792 Long term (current) use of antibiotics: Secondary | ICD-10-CM | POA: Insufficient documentation

## 2015-05-30 DIAGNOSIS — Z79899 Other long term (current) drug therapy: Secondary | ICD-10-CM | POA: Diagnosis not present

## 2015-05-30 DIAGNOSIS — M549 Dorsalgia, unspecified: Secondary | ICD-10-CM | POA: Diagnosis not present

## 2015-05-30 LAB — BRAIN NATRIURETIC PEPTIDE: B Natriuretic Peptide: 27.6 pg/mL (ref 0.0–100.0)

## 2015-05-30 LAB — I-STAT TROPONIN, ED
TROPONIN I, POC: 0 ng/mL (ref 0.00–0.08)
Troponin i, poc: 0 ng/mL (ref 0.00–0.08)
Troponin i, poc: 0.01 ng/mL (ref 0.00–0.08)

## 2015-05-30 LAB — POC URINE PREG, ED: Preg Test, Ur: NEGATIVE

## 2015-05-30 LAB — CBC
HCT: 39 % (ref 36.0–46.0)
Hemoglobin: 13 g/dL (ref 12.0–15.0)
MCH: 27.4 pg (ref 26.0–34.0)
MCHC: 33.3 g/dL (ref 30.0–36.0)
MCV: 82.1 fL (ref 78.0–100.0)
Platelets: 291 10*3/uL (ref 150–400)
RBC: 4.75 MIL/uL (ref 3.87–5.11)
RDW: 13.4 % (ref 11.5–15.5)
WBC: 7 10*3/uL (ref 4.0–10.5)

## 2015-05-30 LAB — I-STAT CHEM 8, ED
BUN: 9 mg/dL (ref 6–20)
CHLORIDE: 104 mmol/L (ref 101–111)
CREATININE: 0.9 mg/dL (ref 0.44–1.00)
Calcium, Ion: 1.23 mmol/L (ref 1.12–1.23)
GLUCOSE: 91 mg/dL (ref 65–99)
HEMATOCRIT: 43 % (ref 36.0–46.0)
Hemoglobin: 14.6 g/dL (ref 12.0–15.0)
Potassium: 4.1 mmol/L (ref 3.5–5.1)
Sodium: 139 mmol/L (ref 135–145)
TCO2: 21 mmol/L (ref 0–100)

## 2015-05-30 MED ORDER — PROMETHAZINE HCL 25 MG/ML IJ SOLN
12.5000 mg | Freq: Once | INTRAMUSCULAR | Status: AC
  Administered 2015-05-30: 12.5 mg via INTRAVENOUS
  Filled 2015-05-30 (×2): qty 1

## 2015-05-30 MED ORDER — ACETAMINOPHEN 325 MG PO TABS
650.0000 mg | ORAL_TABLET | Freq: Once | ORAL | Status: AC
  Administered 2015-05-30: 650 mg via ORAL
  Filled 2015-05-30: qty 2

## 2015-05-30 MED ORDER — IBUPROFEN 200 MG PO TABS: 400.0000 mg | ORAL_TABLET | Freq: Four times a day (QID) | ORAL | Status: DC | PRN

## 2015-05-30 MED ORDER — HEPARIN SODIUM (PORCINE) 5000 UNIT/ML IJ SOLN
5000.0000 [IU] | Freq: Three times a day (TID) | INTRAMUSCULAR | Status: DC
  Administered 2015-05-31: 5000 [IU] via SUBCUTANEOUS
  Filled 2015-05-30: qty 1

## 2015-05-30 MED ORDER — HYDROMORPHONE HCL 1 MG/ML IJ SOLN
1.0000 mg | INTRAMUSCULAR | Status: DC | PRN
  Administered 2015-05-30 – 2015-05-31 (×2): 1 mg via INTRAVENOUS
  Filled 2015-05-30 (×2): qty 1

## 2015-05-30 MED ORDER — NITROGLYCERIN 0.4 MG SL SUBL
0.4000 mg | SUBLINGUAL_TABLET | SUBLINGUAL | Status: DC | PRN
  Administered 2015-05-30 (×2): 0.4 mg via SUBLINGUAL
  Filled 2015-05-30: qty 1

## 2015-05-30 MED ORDER — PROMETHAZINE HCL 25 MG/ML IJ SOLN
12.5000 mg | Freq: Once | INTRAMUSCULAR | Status: AC
  Administered 2015-05-30: 12.5 mg via INTRAVENOUS
  Filled 2015-05-30: qty 1

## 2015-05-30 MED ORDER — NAPROXEN 250 MG PO TABS: 250.0000 mg | ORAL_TABLET | Freq: Two times a day (BID) | ORAL | Status: DC | PRN

## 2015-05-30 MED ORDER — PANTOPRAZOLE SODIUM 40 MG PO TBEC
80.0000 mg | DELAYED_RELEASE_TABLET | Freq: Every day | ORAL | Status: DC
  Administered 2015-05-31: 80 mg via ORAL
  Filled 2015-05-30 (×2): qty 2

## 2015-05-30 MED ORDER — VALSARTAN-HYDROCHLOROTHIAZIDE 320-25 MG PO TABS: 1.0000 | ORAL_TABLET | Freq: Every day | ORAL | Status: DC

## 2015-05-30 MED ORDER — HYDROMORPHONE HCL 1 MG/ML IJ SOLN
1.0000 mg | Freq: Once | INTRAMUSCULAR | Status: AC
  Administered 2015-05-30: 1 mg via INTRAVENOUS
  Filled 2015-05-30: qty 1

## 2015-05-30 MED ORDER — IOHEXOL 350 MG/ML SOLN
100.0000 mL | Freq: Once | INTRAVENOUS | Status: AC | PRN
  Administered 2015-05-30: 100 mL via INTRAVENOUS

## 2015-05-30 MED ORDER — AMLODIPINE BESYLATE 5 MG PO TABS
5.0000 mg | ORAL_TABLET | Freq: Every day | ORAL | Status: DC
  Administered 2015-05-31: 5 mg via ORAL
  Filled 2015-05-30: qty 1

## 2015-05-30 MED ORDER — HYDROCHLOROTHIAZIDE 25 MG PO TABS
25.0000 mg | ORAL_TABLET | Freq: Every day | ORAL | Status: DC
  Administered 2015-05-31: 25 mg via ORAL
  Filled 2015-05-30: qty 1

## 2015-05-30 MED ORDER — ASPIRIN 325 MG PO TABS
325.0000 mg | ORAL_TABLET | Freq: Every day | ORAL | Status: DC
Start: 2015-05-30 — End: 2015-05-31
  Administered 2015-05-30 – 2015-05-31 (×2): 325 mg via ORAL
  Filled 2015-05-30 (×2): qty 1

## 2015-05-30 MED ORDER — ONDANSETRON HCL 4 MG/2ML IJ SOLN: 4.0000 mg | Freq: Four times a day (QID) | INTRAMUSCULAR | Status: DC | PRN

## 2015-05-30 MED ORDER — ACETAMINOPHEN 325 MG PO TABS: 650.0000 mg | ORAL_TABLET | ORAL | Status: DC | PRN

## 2015-05-30 MED ORDER — IRBESARTAN 150 MG PO TABS
300.0000 mg | ORAL_TABLET | Freq: Every day | ORAL | Status: DC
  Administered 2015-05-31: 300 mg via ORAL
  Filled 2015-05-30: qty 2

## 2015-05-30 NOTE — ED Provider Notes (Signed)
CSN: [id number]     Arrival date & time 05/30/15  1459 History   First MD Initiated Contact with Patient 05/30/15 1542     Chief Complaint  Patient presents with  . Chest Pain   Jasmine Roberson is a 44 y.o. 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 The patient reports that her symptoms began with a sensation of "just not feeling right" over the last 3 days. The patient reports this morning, she woke up having chest pressure. The patient says that pressure-like pain is located in her central and left chest. The patient says the pain radiates towards her back. The patient reports associated lightheadedness and shortness of breath. The patient says the pain is much worse with exertion. The patient reports the pain is currently at an 8 out of 10 in severity on arrival and was much higher than that when she exerted herself. The patient is unsure if this feels similar to when she had pulmonary embolism in the past. The patient denies any preceding symptoms such as fevers, chills, nausea, vomiting, constipation, diarrhea, dysuria. The patient denies any new medications. The patient has not taken any medicine to help her symptoms. The patient does not take aspirin on a daily basis.  Shortly after arrival, the patient was given 325 mg of aspirin and will be given sublingual nitroglycerin. Initial concern is for ACS versus PE. Laboratory and imaging was ordered to start the workup to investigate etiology of her symptoms  (Consider location/radiation/quality/duration/timing/severity/associated sxs/prior Treatment) Patient is a 44 y.o. female presenting with chest pain. The history is provided by the patient and a significant other. No language interpreter was used.  Chest Pain Pain location:  Substernal area and L chest Pain quality:  crushing, pressure and radiating   Pain radiates to:  Upper back Pain radiates to the back: yes   Pain severity:  Severe Onset quality:  Gradual Duration:  3 days Timing:  Constant Progression:  Worsening Chronicity:  New Context: breathing and at rest   Relieved by:  Nitroglycerin Worsened by:  Exertion and deep breathing Ineffective treatments:  None tried Associated symptoms: back pain and shortness of breath   Associated symptoms: no abdominal pain, no anxiety, no cough, no diaphoresis, no fever, no headache, no lower extremity edema, no nausea, no palpitations, no syncope and not vomiting   Shortness of breath:    Severity:  Moderate Risk factors: prior DVT/PE     Past Medical History  Diagnosis Date  . HTN (hypertension)   . Diverticulitis   . GERD (gastroesophageal reflux disease)   . Headache(784.0)   . Obesity, Class III, BMI 40-49.9 (morbid obesity) 11/01/2012    BMI 48 on 01Nov2013  . DVT (deep venous thrombosis)   . PE (pulmonary embolism)    Past Surgical History  Procedure Laterality Date  . Cesarean section    . Cholecystectomy    . Abdominal hysterectomy      partial  . Breast surgery      reduction   History reviewed. No pertinent family history. History  Substance Use Topics  . Smoking status: Never Smoker   . Smokeless tobacco: Never Used  . Alcohol Use: No   OB History    Gravida Para Term Preterm AB TAB SAB Ectopic Multiple Living   1 1  1       2  Review of Systems  Constitutional: Negative for fever, chills and diaphoresis.  HENT: Negative for congestion.   Eyes: Negative for visual disturbance.  Respiratory: Positive for shortness of breath. Negative for cough, chest tightness, wheezing and stridor.   Cardiovascular: Positive for chest pain. Negative for palpitations, leg swelling and syncope.  Gastrointestinal: Negative for nausea, vomiting, abdominal pain, diarrhea and constipation.  Genitourinary: Negative for dysuria and flank  pain.  Musculoskeletal: Positive for back pain. Negative for neck pain and neck stiffness.  Skin: Negative for rash and wound.  Neurological: Positive for light-headedness. Negative for headaches.  Psychiatric/Behavioral: Negative for agitation.  All other systems reviewed and are negative.     Allergies  Morphine and related; Penicillins; and Zofran  Home Medications   Prior to Admission medications   Medication Sig Start Date End Date Taking? Authorizing Provider  amLODipine (NORVASC) 5 MG tablet Take 5 mg by mouth daily.    Historical Provider, MD  cloNIDine (CATAPRES) 0.1 MG tablet Take 0.1 mg by mouth daily.    Historical Provider, MD  doxycycline (VIBRAMYCIN) 100 MG capsule Take 1 capsule (100 mg total) by mouth 2 (two) times daily. 02/23/14   Eber Hong, MD  esomeprazole (NEXIUM) 40 MG capsule Take 40 mg by mouth daily as needed (for acid reflux).     Historical Provider, MD  HYDROcodone-acetaminophen (NORCO/VICODIN) 5-325 MG per tablet Take 2 tablets by mouth every 4 (four) hours as needed. 02/23/14   Eber Hong, MD  naproxen (NAPROSYN) 500 MG tablet Take 1 tablet (500 mg total) by mouth 2 (two) times daily with a meal. 02/23/14   Eber Hong, MD  traZODone (DESYREL) 50 MG tablet Take 50 mg by mouth at bedtime as needed for sleep.    Historical Provider, MD  valsartan-hydrochlorothiazide (DIOVAN-HCT) 320-25 MG per tablet Take 1 tablet by mouth daily.    Historical Provider, MD   BP 187/99 mmHg  Pulse 76  Temp(Src) 98.5 F (36.9 C) (Oral)  Resp 20  Wt 315 lb (142.883 kg)  SpO2 100% Physical Exam  Constitutional: She is oriented to person, place, and time. She appears well-developed and well-nourished. No distress.  HENT:  Head: Normocephalic and atraumatic.  Mouth/Throat: No oropharyngeal exudate.  Eyes: Conjunctivae and EOM are normal. Pupils are equal, round, and reactive to light. No scleral icterus.  Neck: Normal range of motion.  Cardiovascular: Normal rate,  regular rhythm, normal heart sounds and intact distal pulses.   No murmur heard. Pulmonary/Chest: Effort normal. No stridor. No respiratory distress. She has no wheezes. She exhibits no tenderness.  Abdominal: Soft. Bowel sounds are normal. She exhibits no distension. There is no tenderness. There is no rebound.  Musculoskeletal: She exhibits no tenderness.  Neurological: She is alert and oriented to person, place, and time. She has normal reflexes. She displays normal reflexes. She exhibits normal muscle tone.  Skin: Skin is warm. No rash noted. She is not diaphoretic. No erythema. No pallor.  Psychiatric: She has a normal mood and affect.  Nursing note and vitals reviewed.   ED Course  Procedures (including critical care time) Labs Review Labs Reviewed  CBC  BRAIN NATRIURETIC PEPTIDE  TROPONIN I  TROPONIN I  TROPONIN I  I-STAT TROPOININ, ED  I-STAT CHEM 8, ED  POC URINE PREG, ED  I-STAT TROPOININ, ED  I-STAT TROPOININ, ED  I-STAT TROPOININ, ED    Imaging Review Ct Angio Chest Pe W/cm &/or Wo Cm  05/30/2015   CLINICAL DATA:  Shortness of breath and chest  pain. Hypertension. History of blood clot per patient.  EXAM: CT ANGIOGRAPHY CHEST WITH CONTRAST  TECHNIQUE: Multidetector CT imaging of the chest was performed using the standard protocol during bolus administration of intravenous contrast. Multiplanar CT image reconstructions and MIPs were obtained to evaluate the vascular anatomy.  CONTRAST:  OMNIPAQUE IOHEXOL 350 MG/ML SOLN  COMPARISON:  CT abdomen/ pelvis 11/03/2012  FINDINGS: Lungs are adequately inflated without lobar consolidation or effusion. There is very subtle hazy attenuation over the upper lobes which may represent mild dependent atelectasis although cannot completely exclude an atypical infectious or inflammatory process. Airways are normal.  Mild cardiomegaly is present. No evidence of pulmonary embolism. Remaining mediastinal structures are unremarkable.  Images  through the upper abdomen demonstrate evidence of a previous cholecystectomy. Remainder the exam is unremarkable.  Review of the MIP images confirms the above findings.  IMPRESSION: No evidence of pulmonary embolism.  Subtle hazy attenuation over the upper lobes likely dependent atelectasis, although cannot completely exclude an atypical infectious or inflammatory process. Consider followup chest CT 4 weeks.  Mild cardiomegaly.   Electronically Signed   By: Elberta Fortis M.D.   On: 05/30/2015 20:12   Dg Chest Portable 1 View  05/30/2015   CLINICAL DATA:  Midsternal chest pain and shortness of breath since this morning.  EXAM: PORTABLE CHEST - 1 VIEW  COMPARISON:  None.  FINDINGS: Lungs are adequately inflated and otherwise clear. Borderline cardiomegaly. Bony structures normal. Soft tissues are prominent.  IMPRESSION: No active disease.   Electronically Signed   By: Elberta Fortis M.D.   On: 05/30/2015 16:25     EKG Interpretation   Date/Time:  Monday May 30 2015 15:03:37 EDT Ventricular Rate:  81 PR Interval:  176 QRS Duration: 92 QT Interval:  356 QTC Calculation: 413 R Axis:   -3 Text Interpretation:  Normal sinus rhythm Normal ECG No significant change  since last tracing Confirmed by Freida Busman  MD, ANTHONY ([id number]) on 05/30/2015  4:16:17 PM      MDM   Hurley Crabtree is a 44 y.o. 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 Initial concern is for possible ACS versus pulmonary embolism given her history of the same.   Shortly after arrival, the patient was given 325 mg of aspirin and will be given sublingual nitroglycerin. Initial concern is for ACS versus PE. Laboratory and imaging was ordered to start the workup to investigate etiology of her symptoms  The patient reports that her chest pain improved following  nitroglycerin administration. The patient was then given Tylenol and subsequently narcotic pain medicine for her headache which followed the nitroglycerin. The patient's diagnostic imaging and laboratory results are seen above. The patient was found to have a normal sinus rhythm on EKG. The patient's troponin was normal, her CBC was unremarkable, and her BMP was unremarkable. Given the patient's history of pulmonary embolism and the description of her chest pain, the patient had a CT of the chest ordered. The CT scan showed no evidence of pulmonary embolism. There was some possible atelectasis versus atypical infection.  The patient's heart score was calculated as a 5 at the bedside given her description of the symptoms, her age, and her risk factors including a mother who had a heart attack when she was young.  As the patient has continued to have intermittent chest pain, the patient will  be admitted to the hospitalist service for further chest pain rule out. The patient did not have any other problems while remaining in the emergency department and was admitted in stable condition. Next  This patient was seen with Dr. Freida Busman, emergency medicine attending.    Final diagnoses:  Chest pain with low risk for cardiac etiology      Theda Belfast, MD 05/31/15 0111

## 2015-05-30 NOTE — ED Notes (Signed)
Spoke to Dr. Julian ReilGardner in regards to patient's request for phernergan. She states she will need something for nausea if she receives dilaudid, allergic to zofran. MD acknowledges, gives verbal order for phernergan.

## 2015-05-30 NOTE — ED Notes (Signed)
Dr. Gardner, hospitalist, at the bedside.  

## 2015-05-30 NOTE — ED Notes (Signed)
Called bed placement as patient has active chest pain. Paging MD for bed type change to step down.

## 2015-05-30 NOTE — ED Notes (Signed)
Reported to Dr. Rush Landmarkegeler patient's pain. MD Adolm Josephacknoweldges, awaiting MD physician for updated plan.

## 2015-05-30 NOTE — ED Provider Notes (Signed)
I saw and evaluated the patient, reviewed the resident's note and I agree with the findings and plan.   EKG Interpretation   Date/Time:  Monday May 30 2015 15:03:37 EDT Ventricular Rate:  81 PR Interval:  176 QRS Duration: 92 QT Interval:  356 QTC Calculation: 413 R Axis:   -3 Text Interpretation:  Normal sinus rhythm Normal ECG No significant change  since last tracing Confirmed by Tyianna Menefee  MD, Evelyn Moch ([id number]) on 05/30/2015  4:16:17 PM     Patient here complaining of pain that began suddenly today in her mid sternal area without radiation. Does have prior history of PE and this is not similar. Patient's EKG is without signs of ACS. Patient to obtain blood work as well as chest CT. Disposition is pending at this time  Lorre NickAnthony Filbert Craze, MD 05/30/15 [phone]

## 2015-05-30 NOTE — ED Notes (Signed)
Pt in c/o chest pain that started this am, states it has worsened as the day has gone by, elevated BP at work, also shortness of breath with exertion, no distress noted

## 2015-05-30 NOTE — ED Notes (Signed)
Spoke to Dr. Julian ReilGardner, admittting MD, in regards to patients request for pain medicine for headache 8/10 and chest pain 5/10. MD acknowledges, no new orders. Informed patient that MD will be in to see patient soon to follow up.

## 2015-05-30 NOTE — H&P (Signed)
[facility] History and Physical  Jasmine Roberson AVW:[id number] DOB: Dec 05, 1971 DOA: 05/30/2015  Referring physician: EDP PCP: No PCP Per Patient   Chief Complaint: chest pain   HPI: Jasmine Roberson is a 44 y.o. female with pmh of obesity, HTN, PE.  Patient presents to ED with cp, SOB, lightheadedness.  Symptoms onset with feeling of "just not feeling right" over last 3 days.  Woke up this morning with left sided chest pressure in central and left chest.  Radiates to back.  Has associated lightheadedness and SOB.  Worse with exertion.  8/10 severity at maximum, improved to 5/10 after SL NTG (although this gave her a headache).  Review of Systems: Systems reviewed.  As above, otherwise negative  Past Medical History  Diagnosis Date  . HTN (hypertension)   . Diverticulitis   . GERD (gastroesophageal reflux disease)   . Headache(784.0)   . Obesity, Class III, BMI 40-49.9 (morbid obesity) 11/01/2012    BMI 48 on 01Nov2013  . DVT (deep venous thrombosis)   . PE (pulmonary embolism)    Past Surgical History  Procedure Laterality Date  . Cesarean section    . Cholecystectomy    . Abdominal hysterectomy      partial  . Breast surgery      reduction   Social History:  reports that she has never smoked. She has never used smokeless tobacco. She reports that she does not drink alcohol or use illicit drugs.  Allergies  Allergen Reactions  . Morphine And Related Hives and Itching  . Penicillins     Hives and lip swelling  . Tomato Hives  . Zofran [Ondansetron Hcl] Itching    History reviewed. No pertinent family history.   Prior to Admission medications   Medication Sig Start Date End Date Taking? Authorizing Provider  amLODipine (NORVASC) 5 MG tablet Take 5 mg by mouth daily.   Yes Historical Provider, MD  esomeprazole (NEXIUM) 40 MG capsule Take 40 mg by mouth daily as needed (for acid reflux).    Yes Historical Provider, MD  ibuprofen (ADVIL,MOTRIN) 200 MG  tablet Take 400 mg by mouth every 6 (six) hours as needed for mild pain.   Yes Historical Provider, MD  naproxen sodium (ANAPROX) 220 MG tablet Take 440 mg by mouth daily as needed (general pain).   Yes Historical Provider, MD  valsartan-hydrochlorothiazide (DIOVAN-HCT) 320-25 MG per tablet Take 1 tablet by mouth daily.   Yes Historical Provider, MD   Physical Exam: Filed Vitals:   05/30/15 2200  BP: 170/86  Pulse: 65  Temp:   Resp: 12    BP 170/86 mmHg  Pulse 65  Temp(Src) 98.5 F (36.9 C) (Oral)  Resp 12  Wt 142.883 kg (315 lb)  SpO2 94%  General Appearance:    Alert, oriented, no distress, appears stated age  Head:    Normocephalic, atraumatic  Eyes:    PERRL, EOMI, sclera non-icteric        Nose:   Nares without drainage or epistaxis. Mucosa, turbinates normal  Throat:   Moist mucous membranes. Oropharynx without erythema or exudate.  Neck:   Supple. No carotid bruits.  No thyromegaly.  No lymphadenopathy.   Back:     No CVA tenderness, no spinal tenderness  Lungs:     Clear to auscultation bilaterally, without wheezes, rhonchi or rales  Chest wall:    No tenderness to palpitation  Heart:    Regular rate and rhythm without murmurs, gallops, rubs  Abdomen:  Soft, non-tender, nondistended, normal bowel sounds, no organomegaly  Genitalia:    deferred  Rectal:    deferred  Extremities:   No clubbing, cyanosis or edema.  Pulses:   2+ and symmetric all extremities  Skin:   Skin color, texture, turgor normal, no rashes or lesions  Lymph nodes:   Cervical, supraclavicular, and axillary nodes normal  Neurologic:   CNII-XII intact. Normal strength, sensation and reflexes      throughout    Labs on Admission:  Basic Metabolic Panel:  Recent Labs Lab 05/30/15 1558  NA 139  K 4.1  CL 104  GLUCOSE 91  BUN 9  CREATININE 0.90   Liver Function Tests: No results for input(s): AST, ALT, ALKPHOS, BILITOT, PROT, ALBUMIN in the last 168 hours. No results for input(s):  LIPASE, AMYLASE in the last 168 hours. No results for input(s): AMMONIA in the last 168 hours. CBC:  Recent Labs Lab 05/30/15 1544 05/30/15 1558  WBC 7.0  --   HGB 13.0 14.6  HCT 39.0 43.0  MCV 82.1  --   PLT 291  --    Cardiac Enzymes: No results for input(s): CKTOTAL, CKMB, CKMBINDEX, TROPONINI in the last 168 hours.  BNP (last 3 results) No results for input(s): PROBNP in the last 8760 hours. CBG: No results for input(s): GLUCAP in the last 168 hours.  Radiological Exams on Admission: Ct Angio Chest Pe W/cm &/or Wo Cm  05/30/2015   CLINICAL DATA:  Shortness of breath and chest pain. Hypertension. History of blood clot per patient.  EXAM: CT ANGIOGRAPHY CHEST WITH CONTRAST  TECHNIQUE: Multidetector CT imaging of the chest was performed using the standard protocol during bolus administration of intravenous contrast. Multiplanar CT image reconstructions and MIPs were obtained to evaluate the vascular anatomy.  CONTRAST:  OMNIPAQUE IOHEXOL 350 MG/ML SOLN  COMPARISON:  CT abdomen/ pelvis 11/03/2012  FINDINGS: Lungs are adequately inflated without lobar consolidation or effusion. There is very subtle hazy attenuation over the upper lobes which may represent mild dependent atelectasis although cannot completely exclude an atypical infectious or inflammatory process. Airways are normal.  Mild cardiomegaly is present. No evidence of pulmonary embolism. Remaining mediastinal structures are unremarkable.  Images through the upper abdomen demonstrate evidence of a previous cholecystectomy. Remainder the exam is unremarkable.  Review of the MIP images confirms the above findings.  IMPRESSION: No evidence of pulmonary embolism.  Subtle hazy attenuation over the upper lobes likely dependent atelectasis, although cannot completely exclude an atypical infectious or inflammatory process. Consider followup chest CT 4 weeks.  Mild cardiomegaly.   Electronically Signed   By: Jasmine Roberson M.D.   On:  05/30/2015 20:12   Dg Chest Portable 1 View  05/30/2015   CLINICAL DATA:  Midsternal chest pain and shortness of breath since this morning.  EXAM: PORTABLE CHEST - 1 VIEW  COMPARISON:  None.  FINDINGS: Lungs are adequately inflated and otherwise clear. Borderline cardiomegaly. Bony structures normal. Soft tissues are prominent.  IMPRESSION: No active disease.   Electronically Signed   By: Jasmine Roberson M.D.   On: 05/30/2015 16:25    EKG: Independently reviewed.  Assessment/Plan Active Problems:   Chest pain with low risk for cardiac etiology   1. Chest pain - HEART score is 3 1. Tele obs 2. Serial trops (negative so far) 3. Dilaudid PRN pain 2. HTN - continue home meds    Code Status: Full  Family Communication: no family in room Disposition Plan: Admit to obs  Time spent: 30 min  Wentworth Edelen M. [facility] Pager [phone]  If 7AM-7PM, please contact the day team taking care of the patient [email or web address] Password [email or web address] 05/30/2015, 11:09 PM

## 2015-05-31 ENCOUNTER — Encounter (HOSPITAL_COMMUNITY): Payer: Self-pay | Admitting: *Deleted

## 2015-05-31 ENCOUNTER — Observation Stay (HOSPITAL_COMMUNITY): Payer: Managed Care, Other (non HMO)

## 2015-05-31 DIAGNOSIS — R079 Chest pain, unspecified: Secondary | ICD-10-CM | POA: Diagnosis not present

## 2015-05-31 LAB — TROPONIN I: Troponin I: <0.03 ng/mL (ref <0.031)

## 2015-05-31 LAB — MRSA PCR SCREENING: MRSA BY PCR: NEGATIVE

## 2015-05-31 MED ORDER — PROMETHAZINE HCL 25 MG PO TABS: 12.5000 mg | ORAL_TABLET | Freq: Four times a day (QID) | ORAL | Status: DC | PRN

## 2015-05-31 MED ORDER — ESOMEPRAZOLE MAGNESIUM 40 MG PO CPDR: 40.0000 mg | DELAYED_RELEASE_CAPSULE | Freq: Two times a day (BID) | ORAL | Status: AC

## 2015-05-31 MED ORDER — PROMETHAZINE HCL 25 MG/ML IJ SOLN
12.5000 mg | Freq: Once | INTRAMUSCULAR | Status: AC
  Administered 2015-05-31: 12.5 mg via INTRAVENOUS
  Filled 2015-05-31: qty 1

## 2015-05-31 MED ORDER — TRAMADOL HCL 50 MG PO TABS: 50.0000 mg | ORAL_TABLET | Freq: Four times a day (QID) | ORAL | Status: DC | PRN

## 2015-05-31 MED ORDER — TRAMADOL HCL 50 MG PO TABS: 50.0000 mg | ORAL_TABLET | Freq: Four times a day (QID) | ORAL | Status: AC | PRN

## 2015-05-31 NOTE — Progress Notes (Signed)
Briefly seen and examined. 44 yo morbidly obese AAF, appears in pain with hand on her head. C/o 8/10 headache and 6/10 chest pain (constant, like "books on my chest") - this has been present for days. Minimal relief with nitro and only dilaudid seems to help. Not positional, non-radiating, no associated dyspnea. Lungs clear, RRR, no murmur or rub, abdomen S/NT. Troponin negative x 3 overnight. EKG NSR without ischemia. CTPA was negative for PE, mild cardiomegatly, no coronary calcium.  Cardiac chest pain very unlikely. Would recommend echo to r/o pericardial effusion, however, suspect this is unlikely. I will review. Mostly likely can d/c later with pain medication and consider treating GI etiology.  Chrystie NoseKenneth C. Dedric Ethington, MD, [facility]ACC Attending Cardiologist [facility]

## 2015-05-31 NOTE — Progress Notes (Signed)
  Echocardiogram 2D Echocardiogram has been performed.  Delcie RochENNINGTON, Seletha Zimmermann 05/31/2015, 11:30 AM

## 2015-05-31 NOTE — ED Notes (Signed)
Spoke to Dr. Julian ReilGardner, patient has active chest pain at this time, MD acknowledges, changes bed request to step down.

## 2015-05-31 NOTE — Discharge Summary (Signed)
Physician Discharge Summary  Latina Craverntionette Rubin UKG:[id number]:[id number] DOB: 24-May-1971 DOA: 05/30/2015  PCP: No PCP Per Patient  Admit date: 05/30/2015 Discharge date: 05/31/2015  Time spent: 35 minutes  Recommendations for Outpatient Follow-up:  Follow up with PCP for referral to GI if pain persist.   Discharge Diagnoses:    Chest pain with low risk for cardiac etiology, probably MSK   HTN   Discharge Condition: Stable.   Diet recommendation: heart healthy  Filed Weights   05/30/15 1510 05/31/15 0052  Weight: 142.883 kg (315 lb) 142.1 kg (313 lb 4.4 oz)    History of present illness:  Jasmine Roberson is a 44 y.o. female with pmh of obesity, HTN, PE. Patient presents to ED with cp, SOB, lightheadedness. Symptoms onset with feeling of "just not feeling right" over last 3 days. Woke up this morning with left sided chest pressure in central and left chest. Radiates to back. Has associated lightheadedness and SOB. Worse with exertion. 8/10 severity at maximum, improved to 5/10 after SL NTG (although this gave her a headache).  Hospital Course:  1-Chest pain; atypical. Suspect MSK. Will increase protonix to BID.  ECHO normal, CT angio negative for PE, troponin negative.  Plan to discharge on tramadol./  Chest pain is not related with meals/   2-HTN; continue with home medications.   Procedures:  ECHO; normal Ef.   Consultations:  Cardiology  Discharge Exam: Filed Vitals:   05/31/15 0734  BP: 123/61  Pulse: 60  Temp: 97.8 F (36.6 C)  Resp: 15    General: Alert in no distress.  Cardiovascular: S 1, S 2 RRR Respiratory: CTA  Discharge Instructions   Discharge Instructions    Diet - low sodium heart healthy    Complete by:  As directed      Increase activity slowly    Complete by:  As directed           Current Discharge Medication List    START taking these medications   Details  traMADol (ULTRAM) 50 MG tablet Take 1 tablet (50 mg total) by mouth  every 6 (six) hours as needed for moderate pain. Qty: 30 tablet, Refills: 0      CONTINUE these medications which have CHANGED   Details  esomeprazole (NEXIUM) 40 MG capsule Take 1 capsule (40 mg total) by mouth 2 (two) times daily before a meal. Qty: 30 capsule, Refills: 0      CONTINUE these medications which have NOT CHANGED   Details  amLODipine (NORVASC) 5 MG tablet Take 5 mg by mouth daily.    valsartan-hydrochlorothiazide (DIOVAN-HCT) 320-25 MG per tablet Take 1 tablet by mouth daily.      STOP taking these medications     ibuprofen (ADVIL,MOTRIN) 200 MG tablet      naproxen sodium (ANAPROX) 220 MG tablet        Allergies  Allergen Reactions  . Morphine And Related Hives and Itching  . Penicillins     Hives and lip swelling  . Tomato Hives  . Zofran [Ondansetron Hcl] Itching      The results of significant diagnostics from this hospitalization (including imaging, microbiology, ancillary and laboratory) are listed below for reference.    Significant Diagnostic Studies: Ct Angio Chest Pe W/cm &/or Wo Cm  05/30/2015   CLINICAL DATA:  Shortness of breath and chest pain. Hypertension. History of blood clot per patient.  EXAM: CT ANGIOGRAPHY CHEST WITH CONTRAST  TECHNIQUE: Multidetector CT imaging of the chest was performed using the  standard protocol during bolus administration of intravenous contrast. Multiplanar CT image reconstructions and MIPs were obtained to evaluate the vascular anatomy.  CONTRAST:  OMNIPAQUE IOHEXOL 350 MG/ML SOLN  COMPARISON:  CT abdomen/ pelvis 11/03/2012  FINDINGS: Lungs are adequately inflated without lobar consolidation or effusion. There is very subtle hazy attenuation over the upper lobes which may represent mild dependent atelectasis although cannot completely exclude an atypical infectious or inflammatory process. Airways are normal.  Mild cardiomegaly is present. No evidence of pulmonary embolism. Remaining mediastinal structures are  unremarkable.  Images through the upper abdomen demonstrate evidence of a previous cholecystectomy. Remainder the exam is unremarkable.  Review of the MIP images confirms the above findings.  IMPRESSION: No evidence of pulmonary embolism.  Subtle hazy attenuation over the upper lobes likely dependent atelectasis, although cannot completely exclude an atypical infectious or inflammatory process. Consider followup chest CT 4 weeks.  Mild cardiomegaly.   Electronically Signed   By: Elberta Fortis M.D.   On: 05/30/2015 20:12   Dg Chest Portable 1 View  05/30/2015   CLINICAL DATA:  Midsternal chest pain and shortness of breath since this morning.  EXAM: PORTABLE CHEST - 1 VIEW  COMPARISON:  None.  FINDINGS: Lungs are adequately inflated and otherwise clear. Borderline cardiomegaly. Bony structures normal. Soft tissues are prominent.  IMPRESSION: No active disease.   Electronically Signed   By: Elberta Fortis M.D.   On: 05/30/2015 16:25    Microbiology: Recent Results (from the past 240 hour(s))  MRSA PCR Screening     Status: None   Collection Time: 05/31/15  1:07 AM  Result Value Ref Range Status   MRSA by PCR NEGATIVE NEGATIVE Final    Comment:        The GeneXpert MRSA Assay (FDA approved for NASAL specimens only), is one component of a comprehensive MRSA colonization surveillance program. It is not intended to diagnose MRSA infection nor to guide or monitor treatment for MRSA infections.      Labs: Basic Metabolic Panel:  Recent Labs Lab 05/30/15 1558  NA 139  K 4.1  CL 104  GLUCOSE 91  BUN 9  CREATININE 0.90   Liver Function Tests: No results for input(s): AST, ALT, ALKPHOS, BILITOT, PROT, ALBUMIN in the last 168 hours. No results for input(s): LIPASE, AMYLASE in the last 168 hours. No results for input(s): AMMONIA in the last 168 hours. CBC:  Recent Labs Lab 05/30/15 1544 05/30/15 1558  WBC 7.0  --   HGB 13.0 14.6  HCT 39.0 43.0  MCV 82.1  --   PLT 291  --     Cardiac Enzymes:  Recent Labs Lab 05/30/15 2332 05/31/15 0428  TROPONINI <0.03 <0.03   BNP: BNP (last 3 results)  Recent Labs  05/30/15 1544  BNP 27.6    ProBNP (last 3 results) No results for input(s): PROBNP in the last 8760 hours.  CBG: No results for input(s): GLUCAP in the last 168 hours.     SignedHartley Barefoot A  [facility] 05/31/2015, 12:48 PM

## 2015-05-31 NOTE — ED Notes (Signed)
Dr. Julian ReilGardner, hospitalist, repaged.

## 2016-11-07 ENCOUNTER — Encounter: Payer: Self-pay | Admitting: Endocrinology

## 2016-11-14 ENCOUNTER — Encounter: Payer: Self-pay | Admitting: Pulmonary Disease

## 2016-11-14 ENCOUNTER — Ambulatory Visit (INDEPENDENT_AMBULATORY_CARE_PROVIDER_SITE_OTHER): Payer: Managed Care, Other (non HMO) | Admitting: Pulmonary Disease

## 2016-11-14 DIAGNOSIS — R918 Other nonspecific abnormal finding of lung field: Secondary | ICD-10-CM

## 2016-11-14 NOTE — Assessment & Plan Note (Signed)
Differential diagnosis of mosaic attenuation is broad and includes small airways disease and also interstitial lung disease. Of note she had similar appearance on her CT in 2016. There does not seem to be progression since then.  It is conceivable that this appearance could simply be due to her morbid obesity and atelectatic lungs in the supine position. Since she does not have any other indicators of lung disease, we will simply proceed with a high resolution CT scanning of the chest in 6 months to clarify. We will obtain inspiratory and expiratory films to be sure that mosaic attenuation is not simply an artifact of respiration.  She does not seem to have any stigmata of collagen-vascular disease

## 2016-11-14 NOTE — Patient Instructions (Signed)
Repeat CT scan of the lungs in 6 months We will obtain high-resolution images to better clarify

## 2016-11-14 NOTE — Progress Notes (Signed)
Subjective:    Patient ID: Jasmine Roberson, female    DOB: 1971-10-01, 45 y.o.   MRN: [id number]  HPI   Chief Complaint  Patient presents with  . pulmonary consult    Per Dr. Evlyn KannerSouth. last CT 11-06-16. pt c/o sob with exertion, chest discomfort with exertion & occ at rest.    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 She reports dyspnea on exertion class II for many years. When chest pain persisted she was evaluated by PCP and CT angiogram of her chest was done. This was a suboptimal evaluation but negative for pulmonary embolism. It showed mild mosaic attenuation of the lungs- the formal images are not available for me to review today. I note CT angiogram from 05/2015 -this showed subtle hazy attenuation within the upper lobes.  I also reviewed CT abdomen from 10/2012-the lower lobes of the lungs showed dependent atelectasis on these films.  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 Environmental history-does not have pets, no exposure to mold or damp areas in the house   Past Medical History:  Diagnosis Date  . Diverticulitis   . DVT (deep venous thrombosis) (HCC)   . GERD (gastroesophageal reflux disease)   . Headache(784.0)   . HTN (hypertension)   . Obesity, Class III, BMI 40-49.9 (morbid obesity) (HCC) 11/01/2012   BMI 48 on 01Nov2013  . PE (pulmonary embolism)     Past Surgical History:  Procedure Laterality Date  . ABDOMINAL HYSTERECTOMY     partial  . BREAST SURGERY     reduction  . CESAREAN SECTION    . CHOLECYSTECTOMY       Allergies  Allergen Reactions  . Morphine And Related Hives and Itching  . Penicillins       Hives and lip swelling  . Tomato Hives  . Zofran [Ondansetron Hcl] Itching    Social History   Social History  . Marital status: Married    Spouse name: N/A  . Number of children: N/A  . Years of education: N/A   Occupational History  . Not on file.   Social History Main Topics  . Smoking status: Never Smoker  . Smokeless tobacco: Never Used  . Alcohol use No  . Drug use: No  . Sexual activity: Yes    Birth control/ protection: Surgical   Other Topics Concern  . Not on file   Social History Narrative  . No narrative on file     Family History  Problem Relation Age of Onset  . Heart disease Mother   . Kidney failure Mother   . Hypertension Sister      Review of Systems  Constitutional: Negative for fever and unexpected weight change.  HENT: Negative for congestion, dental problem, ear pain, nosebleeds, postnasal drip, rhinorrhea, sinus pressure, sneezing, sore throat and trouble swallowing.   Eyes: Negative for redness and itching.  Respiratory: Positive for chest tightness and shortness of breath. Negative for cough and wheezing.   Cardiovascular: Positive for leg swelling. Negative for palpitations.  Gastrointestinal: Negative for nausea and vomiting.  Genitourinary: Negative for dysuria.  Musculoskeletal: Negative for joint swelling.  Skin: Negative for rash.  Neurological: Negative for headaches.  Hematological: Does not bruise/bleed easily.  Psychiatric/Behavioral: Negative for dysphoric mood. The patient is not nervous/anxious.        Objective:   Physical Exam  Gen. Pleasant, obese, in no distress, normal affect ENT - no lesions, no post nasal drip, class 2 airway Neck: No JVD, no thyromegaly, no carotid bruits Lungs: no use of accessory muscles, no dullness to percussion, decreased without rales or rhonchi  Cardiovascular: Rhythm regular, heart sounds  normal, no murmurs or gallops, no peripheral edema Abdomen: soft and non-tender, no  hepatosplenomegaly, BS normal. Musculoskeletal: No deformities, no cyanosis or clubbing Neuro:  alert, non focal, no tremors       Assessment & Plan:

## 2017-05-19 ENCOUNTER — Encounter (HOSPITAL_COMMUNITY): Payer: Self-pay | Admitting: Emergency Medicine

## 2017-05-19 ENCOUNTER — Emergency Department (HOSPITAL_COMMUNITY)
Admission: EM | Admit: 2017-05-19 | Discharge: 2017-05-19 | Disposition: A | Discharge status: Home / Self Care | Payer: BLUE CROSS/BLUE SHIELD | Attending: Emergency Medicine | Admitting: Emergency Medicine

## 2017-05-19 DIAGNOSIS — I10 Essential (primary) hypertension: Secondary | ICD-10-CM | POA: Diagnosis not present

## 2017-05-19 DIAGNOSIS — I891 Lymphangitis: Secondary | ICD-10-CM | POA: Insufficient documentation

## 2017-05-19 DIAGNOSIS — R21 Rash and other nonspecific skin eruption: Secondary | ICD-10-CM

## 2017-05-19 DIAGNOSIS — Z79899 Other long term (current) drug therapy: Secondary | ICD-10-CM | POA: Diagnosis not present

## 2017-05-19 DIAGNOSIS — M79622 Pain in left upper arm: Secondary | ICD-10-CM | POA: Diagnosis present

## 2017-05-19 MED ORDER — TRIAMCINOLONE ACETONIDE 0.1 % EX CREA: TOPICAL_CREAM | CUTANEOUS | 0 refills | Status: AC

## 2017-05-19 MED ORDER — SULFAMETHOXAZOLE-TRIMETHOPRIM 800-160 MG PO TABS: 1.0000 | ORAL_TABLET | Freq: Two times a day (BID) | ORAL | 0 refills | Status: AC

## 2017-05-19 NOTE — Discharge Instructions (Signed)
There is no clear cause for the rash, and area of redness of the left upper arm.  There may be an allergy causing it in which case Claritin for itching might help.  We will prescribe a topical steroid for rash, and an antibiotic in case the lymphangitis is infection.  There could also be a component of inflammation so you could try using a heating pad on the area of discomfort to help improve it.

## 2017-05-19 NOTE — ED Notes (Signed)
Per EDP BMP ordered in error. Phleb already drew. Main lab called and made aware. Pt up for discharge.

## 2017-05-19 NOTE — ED Triage Notes (Addendum)
Rash under left arm that has now spread and it is now swollen and warm , pmh of dvt and pe

## 2017-05-19 NOTE — ED Provider Notes (Signed)
[facility]-EMERGENCY DEPT Provider Note   CSN: [id number] Arrival date & time: 05/19/17  1309     History   Chief Complaint Chief Complaint  Patient presents with  . Arm Pain    HPI Jasmine Roberson is a 46 y.o. female.  Patient noticed some itching on her arms, today while at work, and developed a hot sensation of her left upper arm.  At this time most of the itching is on her left upper arm, medial aspect.  She denies fever, chills, nausea, vomiting, trauma, shortness of breath or chest pain.  She was somewhat worried about a DVT, so came here for evaluation.  She works as an Psychiatric nurseadministrative coordinator nurse.  There are no other known modifying factors  HPI  Past Medical History:  Diagnosis Date  . Diverticulitis   . DVT (deep venous thrombosis) (HCC)   . GERD (gastroesophageal reflux disease)   . Headache(784.0)   . HTN (hypertension)   . Obesity, Class III, BMI 40-49.9 (morbid obesity) (HCC) 11/01/2012   BMI 48 on 01Nov2013  . PE (pulmonary embolism)     Patient Active Problem List   Diagnosis Date Noted  . Abnormal CT scan of lung 11/14/2016  . Chest pain with low risk for cardiac etiology 05/30/2015  . Epiploic appendagitis 11/01/2012  . Obesity, Class III, BMI 40-49.9 (morbid obesity) (HCC) 11/01/2012  . Nausea & vomiting 10/29/2012  . Hypertension 10/29/2012  . Abdominal pain 10/29/2012    Past Surgical History:  Procedure Laterality Date  . ABDOMINAL HYSTERECTOMY     partial  . BREAST SURGERY     reduction  . CESAREAN SECTION    . CHOLECYSTECTOMY      OB History    Gravida Para Term Preterm AB Living   1 1   1   2    SAB TAB Ectopic Multiple Live Births                   Home Medications    Prior to Admission medications   Medication Sig Start Date End Date Taking? Authorizing Provider  amLODipine (NORVASC) 5 MG tablet Take 5 mg by mouth daily.    [provider]  esomeprazole (NEXIUM) 40 MG capsule Take 1 capsule (40 mg total) by  mouth 2 (two) times daily before a meal. 05/31/15   Regalado, Belkys A, MD  sulfamethoxazole-trimethoprim (BACTRIM DS,SEPTRA DS) 800-160 MG tablet Take 1 tablet by mouth 2 (two) times daily. 05/19/17 05/26/17  Mancel BaleWentz, Sander Speckman, MD  traMADol (ULTRAM) 50 MG tablet Take 1 tablet (50 mg total) by mouth every 6 (six) hours as needed for moderate pain. 05/31/15   Regalado, Belkys A, MD  triamcinolone cream (KENALOG) 0.1 % Use 4 times a day on rash of the left arm 05/19/17   Mancel BaleWentz, Rekha Hobbins, MD  valsartan-hydrochlorothiazide (DIOVAN-HCT) 320-25 MG per tablet Take 1 tablet by mouth daily.    [provider]    Family History Family History  Problem Relation Age of Onset  . Heart disease Mother   . Kidney failure Mother   . Hypertension Sister     Social History Social History  Substance Use Topics  . Smoking status: Never Smoker  . Smokeless tobacco: Never Used  . Alcohol use No     Allergies   Morphine and related; Penicillins; Tomato; and Zofran [ondansetron hcl]   Review of Systems Review of Systems  All other systems reviewed and are negative.    Physical Exam Updated Vital Signs BP (!) 163/87 (  BP Location: Left Arm)   Pulse 71   Temp 98.3 F (36.8 C) (Oral)   Resp 20   SpO2 99%   Physical Exam  Constitutional: She is oriented to person, place, and time. She appears well-developed and well-nourished. No distress.  HENT:  Head: Normocephalic and atraumatic.  Eyes: Conjunctivae and EOM are normal. Pupils are equal, round, and reactive to light.  Neck: Normal range of motion and phonation normal. Neck supple.  Cardiovascular: Normal rate and regular rhythm.   Pulmonary/Chest: Effort normal and breath sounds normal. She exhibits no tenderness.  Musculoskeletal: Normal range of motion.  No localized swelling of either arm.  Neurological: She is alert and oriented to person, place, and time. She exhibits normal muscle tone.  Skin: Skin is warm and dry.  Left upper arm  with nonspecific mild papular eruption, skin colored, papules about 2 mm in diameter.  There is also a flat reddened area, about 2 cm x 10 cm in length, along the medial aspect, and an area consistent with location of the lymph vessels.  This could be indicative of a mild lymphangitis.  There is no associated distal swelling in the left arm.  Psychiatric: She has a normal mood and affect. Her behavior is normal. Judgment and thought content normal.  Nursing note and vitals reviewed.    ED Treatments / Results  Labs (all labs ordered are listed, but only abnormal results are displayed) Labs Reviewed - No data to display  EKG  EKG Interpretation None       Radiology No results found.  Procedures Procedures (including critical care time)  Medications Ordered in ED Medications - No data to display   Initial Impression / Assessment and Plan / ED Course  I have reviewed the triage vital signs and the nursing notes.  Pertinent labs & imaging results that were available during my care of the patient were reviewed by me and considered in my medical decision making (see chart for details).      Patient Vitals for the past 24 hrs:  BP Temp Temp src Pulse Resp SpO2  05/19/17 1341 (!) 163/87 98.3 F (36.8 C) Oral 71 20 99 %    2:45 PM Reevaluation with update and discussion. After initial assessment and treatment, an updated evaluation reveals no change in mental status.  Findings discussed with the patient and all questions were answered. Sharica Roedel L    Final Clinical Impressions(s) / ED Diagnoses   Final diagnoses:  Rash  Lymphangitis   Nonspecific pruritus, and rash, with possible lymphangitis.  No evident portal for infection, as a source for lymphangitis.  No known trauma.  No systemic symptoms.  No angioedema.  Will cover for dermatitis and lymphangitis, and recommend antihistamine for itching, and heat compress to improve possible infection.  Nursing Notes Reviewed/  Care Coordinated Applicable Imaging Reviewed Interpretation of Laboratory Data incorporated into ED treatment  The patient appears reasonably screened and/or stabilized for discharge and I doubt any other medical condition or other [facility] requiring further screening, evaluation, or treatment in the ED at this time prior to discharge.  Plan: Home Medications-continue usual medicine.  Use Claritin for itching; Home Treatments-heat applications to area of discomfort; return here if the recommended treatment, does not improve the symptoms; Recommended follow up-PCP, as needed   New Prescriptions New Prescriptions   SULFAMETHOXAZOLE-TRIMETHOPRIM (BACTRIM DS,SEPTRA DS) 800-160 MG TABLET    Take 1 tablet by mouth 2 (two) times daily.   TRIAMCINOLONE CREAM (KENALOG) 0.1 %  Use 4 times a day on rash of the left arm     Mancel Bale, MD 05/19/17 1445

## 2017-06-21 ENCOUNTER — Encounter: Payer: Self-pay | Admitting: Pulmonary Disease

## 2017-10-18 ENCOUNTER — Encounter (HOSPITAL_COMMUNITY): Payer: Self-pay | Admitting: Emergency Medicine

## 2017-10-18 ENCOUNTER — Emergency Department (HOSPITAL_BASED_OUTPATIENT_CLINIC_OR_DEPARTMENT_OTHER)
Admit: 2017-10-18 | Discharge: 2017-10-18 | Disposition: A | Discharge status: Home / Self Care | Payer: BLUE CROSS/BLUE SHIELD

## 2017-10-18 ENCOUNTER — Emergency Department (HOSPITAL_COMMUNITY)
Admission: EM | Admit: 2017-10-18 | Discharge: 2017-10-18 | Disposition: A | Discharge status: Home / Self Care | Payer: BLUE CROSS/BLUE SHIELD | Attending: Emergency Medicine | Admitting: Emergency Medicine

## 2017-10-18 ENCOUNTER — Emergency Department (HOSPITAL_COMMUNITY): Payer: BLUE CROSS/BLUE SHIELD

## 2017-10-18 DIAGNOSIS — M79609 Pain in unspecified limb: Secondary | ICD-10-CM | POA: Diagnosis not present

## 2017-10-18 DIAGNOSIS — I10 Essential (primary) hypertension: Secondary | ICD-10-CM | POA: Insufficient documentation

## 2017-10-18 DIAGNOSIS — M7989 Other specified soft tissue disorders: Secondary | ICD-10-CM

## 2017-10-18 DIAGNOSIS — M25562 Pain in left knee: Secondary | ICD-10-CM | POA: Diagnosis present

## 2017-10-18 DIAGNOSIS — Z86718 Personal history of other venous thrombosis and embolism: Secondary | ICD-10-CM | POA: Diagnosis not present

## 2017-10-18 MED ORDER — NAPROXEN 500 MG PO TABS: 500.0000 mg | ORAL_TABLET | Freq: Two times a day (BID) | ORAL | 0 refills | Status: AC | PRN

## 2017-10-18 NOTE — Progress Notes (Signed)
*  PRELIMINARY RESULTS* Vascular Ultrasound Left lower ext venous duplex has been completed.  Preliminary findings: No evidence of DVT in visualized veins or baker's cyst.    Farrel DemarkJill Eunice, RDMS, RVT  10/18/2017, 11:03 AM

## 2017-10-18 NOTE — ED Notes (Signed)
Pt called out wanting to see the nurse, RN notifed.

## 2017-10-18 NOTE — ED Triage Notes (Signed)
Pt presents with L knee pain x 3 wks when she felt a pop to posterior knee; pt denies tingling, numbness, discoloration but noted swelling; this RN cannot appreciate swelling; pt ambulatory to triage

## 2017-10-18 NOTE — ED Provider Notes (Signed)
[facility] Emergency Department Provider Note  ED Clinical Impression   Acute pain of left knee  History   Chief Complaint Knee Pain   HPI  Patient is a 46 y.o. female with a PMH of diverticulitis, gerd, htn, DVT and PE (approx 15 years ago, at that time on coumadin, no longer on anticoagulation) who presents to ED for left knee pain, initial onset 3 weeks ago while walking down the stairs and felt a "pop" in the back of her knee, pain has improved but intermittently pain returns to left posterior knee, no known exacerbating or alleviating factors. No previous surgeries to LLE. No numbness or tingling. No rash to area. Has not noticed redness, swelling, or warmth to knee. Denies fevers, chills, unexplained weight loss, dizziness, CP, SOB, cough, pleurisy, abd pain, n/v/d, dysuria, extremity numbness or tingling, rash, or any additional concerns. No hx of malignancy.   Past Medical History:  Diagnosis Date  . Diverticulitis   . DVT (deep venous thrombosis) (HCC)   . GERD (gastroesophageal reflux disease)   . Headache(784.0)   . HTN (hypertension)   . Obesity, Class III, BMI 40-49.9 (morbid obesity) (HCC) 11/01/2012   BMI 48 on 01Nov2013  . PE (pulmonary embolism)     Past Surgical History:  Procedure Laterality Date  . ABDOMINAL HYSTERECTOMY     partial  . BREAST SURGERY     reduction  . CESAREAN SECTION    . CHOLECYSTECTOMY      Current Outpatient Rx  . Order #: [id number] Class: Historical Med  . Order #: [id number] Class: Historical Med  . Order #: [id number] Class: Print  . Order #: [id number] Class: Print  . Order #: [id number] Class: Print  . Order #: [id number] Class: Historical Med    Allergies Compazine [prochlorperazine]; Morphine and related; Penicillins; Tomato; and Zofran [ondansetron hcl]  Family History  Problem Relation Age of Onset  . Heart disease Mother   . Kidney failure Mother   . Hypertension Sister     Social History Social History  Substance Use  Topics  . Smoking status: Never Smoker  . Smokeless tobacco: Never Used  . Alcohol use No    Review of Systems  Constitutional: Negative for fever, chills, or unexplained weight loss. Eyes: Negative for visual changes. Cardiovascular: Negative for chest pain or palpitations. Respiratory: Negative for shortness of breath or cough. Negative for hemoptysis or pleurisy. Gastrointestinal: Negative for abdominal pain, nausea, vomiting, or diarrhea. Genitourinary: Negative for dysuria or hematuria. Musculoskeletal: + right knee pain. Negative for back pain. Skin: Negative for rash. Neurological: Negative for headaches, dizziness, focal weakness, or numbness/tingling.  Physical Exam   VITAL SIGNS:   ED Triage Vitals  Enc Vitals Group     BP 10/18/17 0617 (!) 150/100     Pulse Rate 10/18/17 0617 65     Resp 10/18/17 0617 16     Temp 10/18/17 0617 98.2 F (36.8 C)     Temp Source 10/18/17 0617 Oral     SpO2 10/18/17 0617 100 %     Weight --      Height --      Head Circumference --      Peak Flow --      Pain Score 10/18/17 0634 5     Pain Loc --      Pain Edu? --      Excl. in GC? --     Constitutional: Alert and oriented. Well appearing and in no respiratory apparent distress. Eyes: PERRL, EOMI, Conjunctivae normal ENT  Head: Normocephalic and atraumatic.      Mouth/Throat: Mucous membranes are moist. Oropharynx without erythema or exudate. Normal voice, handling secretions normally.      Neck: Supple, no nuchal signs, full active ROM of neck. Cardiovascular: Normal S1 S2, regular rhythm, normal rate. Normal and symmetric distal pulses are present in all extremities. Respiratory: Breath sounds clear and equal bilaterally. No wheezes, rales, or rhonchi. Normal respiratory effort.  Gastrointestinal: Abdomen soft and nontender. No rebound or guarding. There is no CVA tenderness. Back: No midline spinal or paraspinal tenderness to palpation.  Musculoskeletal: +L posterior  knee mildly ttp, full active ROM; no baker's cyst noted. No sig effusion, no STS; skin intact without erythema/warmth/ecchymosis to area. No medial or lateral joint line tenderness. Negative anterior posterior drawer, no patellar tenderness, fibular head nontender, able to flex 90 degrees. No pitting edema to lower extremities. Sharp and dull sensation grossly intact to bilateral LE, dorsalis pedis and posterior tibial pulses 2+, cap refill < 2sec. No calf erythema, warmth, or generalized swelling noted. No acute signs in all other extremities. Neurologic: Speech clear. Alert and appropriate, no gross focal neurologic deficits are appreciated. Gait steady with ambulation. Equal strength in all four extremities. Extremities neurovascularly intact.  Skin: Skin is warm, dry. Psychiatric: Mood and affect are normal. Speech and behavior are normal.  Labs   Labs Reviewed - No data to display  Radiology   DG Knee Complete 4 Views Left  Final Result       ED Course, Assessment and Plan   Patient is a 46 y/o F, afebrile, who presents to ED for left knee pain, concern for strain. Will get xray for acute fracture. No evidence of neurovascular compromise. Compartments soft to LLE, doubt compartment syndrome. Doubt osteomyelitis, gout, cellulitis, or septic joint. Given hx of DVT in the past, will also get venous duplex if xray negative for acute osseous abnormalities.  11:53 AM Xray with small knee joint effusion without acute osseous abnormality identified. DVT study negative. Discussed RICE, ace bandage and orthopedic follow up.   11:58 AM Discussed results, discharge instructions, rx and safety, RICE, return precautions, and follow up. BP also elevated today; discussed importance of continuing to take BP medications (has not taken these today), BP monitoring, and repeat BP eval by PC in 1 week. Pt verbalizes understanding using verbal teachback and agrees with plan, denies any additional concerns.    Previous chart, nursing notes, and vital signs reviewed.    Pertinent labs & imaging results that were available during my care of the patient were reviewed by me and considered in my medical decision making (see chart for details).    Lunna Vogelgesang, Hinton DyerRobyn K, NP 10/23/17 1535    Alvira MondaySchlossman, Erin, MD 10/29/17 2032

## 2017-10-18 NOTE — Discharge Instructions (Signed)
You were seen in the ER for left knee pain. Please take Naproxen as needed for pain--follow over the counter label instructions; please take with food as may cause GI upset. Please call to schedule a follow up appointment with your primary care doctor in 3-5 days as well as orthopedics in 1 week. Practice RICE (see instructions below). Return to the ER if you experience fevers, chills, unexplained weight loss, dizziness, headaches, neck pain, chest pain, shortness of breath, abdominal pain, nausea,vomiting, diarrhea, extremity numbness or tingling, redness/warmth to knee, increased swelling to knee, decreased sensation, increased pain, worsening symptoms, or any additional concerns.  Self-care for Knee Injury: Rest your knee. Rest helps decrease swelling and allows the injury to heal. You can do gentle range of motion (ROM) exercises as directed. This will prevent stiffness. Apply ice to your knee for 15 minutes four times a day. Use an ice pack, or put crushed ice in a plastic bag. Cover it with a towel. Do not apply ice directly to skin. Ice helps prevent tissue damage and decreases swelling and pain. Apply compression to your knee as directed. You may need to wear an elastic bandage. This helps keep your injured knee from moving too much while it heals. You can loosen or tighten the elastic bandage to make it comfortable. It should be tight enough for you to feel support. It should not be so tight that it causes your toes to be numb or tingly. If you are wearing an elastic bandage, take it off and rewrap it at least once a day. Elevate your knee above the level of your heart as often as you can. This will help decrease swelling and pain. Prop your leg on pillows or blankets to keep it elevated comfortably. Do not put pillows directly behind your knee.

## 2018-03-10 ENCOUNTER — Encounter (HOSPITAL_COMMUNITY): Payer: Self-pay

## 2018-03-10 ENCOUNTER — Emergency Department (HOSPITAL_COMMUNITY)
Admission: EM | Admit: 2018-03-10 | Discharge: 2018-03-10 | Disposition: A | Discharge status: Home / Self Care | Payer: BLUE CROSS/BLUE SHIELD | Attending: Emergency Medicine | Admitting: Emergency Medicine

## 2018-03-10 ENCOUNTER — Other Ambulatory Visit: Payer: Self-pay

## 2018-03-10 ENCOUNTER — Emergency Department (HOSPITAL_BASED_OUTPATIENT_CLINIC_OR_DEPARTMENT_OTHER)
Admit: 2018-03-10 | Discharge: 2018-03-10 | Disposition: A | Discharge status: Home / Self Care | Payer: BLUE CROSS/BLUE SHIELD | Attending: Emergency Medicine | Admitting: Emergency Medicine

## 2018-03-10 DIAGNOSIS — M7989 Other specified soft tissue disorders: Secondary | ICD-10-CM

## 2018-03-10 DIAGNOSIS — M79609 Pain in unspecified limb: Secondary | ICD-10-CM | POA: Diagnosis not present

## 2018-03-10 DIAGNOSIS — Z79899 Other long term (current) drug therapy: Secondary | ICD-10-CM | POA: Insufficient documentation

## 2018-03-10 DIAGNOSIS — R2231 Localized swelling, mass and lump, right upper limb: Secondary | ICD-10-CM | POA: Diagnosis not present

## 2018-03-10 DIAGNOSIS — I159 Secondary hypertension, unspecified: Secondary | ICD-10-CM | POA: Diagnosis not present

## 2018-03-10 DIAGNOSIS — Z7982 Long term (current) use of aspirin: Secondary | ICD-10-CM | POA: Insufficient documentation

## 2018-03-10 DIAGNOSIS — M79601 Pain in right arm: Secondary | ICD-10-CM | POA: Insufficient documentation

## 2018-03-10 NOTE — Progress Notes (Signed)
RUE venous duplex prelim: negative for DVT and SVT. Laurina Fischl Eunice, RDMS, RVT  

## 2018-03-10 NOTE — ED Triage Notes (Signed)
Pt here for right arm pain that has been persistent since Saturday. Pt states she has hx of dvt and is concerned it could be the same.

## 2018-03-10 NOTE — ED Provider Notes (Signed)
[facility] Provider Note   CSN: [id number] Arrival date & time: 03/10/18  1507     History   Chief Complaint Chief Complaint  Patient presents with  . Arm Pain    HPI Jasmine Roberson is a 47 y.o. female.  HPI Jasmine Roberson is a 47 y.o. female with history of DVT and PE, presents to emergency department complaint of right arm pain and swelling.  Patient states she developed swelling in the arm 2 days ago.  She has tried to keep it elevated, however pain and swelling did not improve.  She is currently not anticoagulated and states that she was worried that she may have a DVT.  She has not tried any medications prior to coming in.  She has not had any injuries.  She has no other complaints.  Past Medical History:  Diagnosis Date  . Diverticulitis   . DVT (deep venous thrombosis) (HCC)   . GERD (gastroesophageal reflux disease)   . Headache(784.0)   . HTN (hypertension)   . Obesity, Class III, BMI 40-49.9 (morbid obesity) (HCC) 11/01/2012   BMI 48 on 01Nov2013  . PE (pulmonary embolism)     Patient Active Problem List   Diagnosis Date Noted  . Abnormal CT scan of lung 11/14/2016  . Chest pain with low risk for cardiac etiology 05/30/2015  . Epiploic appendagitis 11/01/2012  . Obesity, Class III, BMI 40-49.9 (morbid obesity) (HCC) 11/01/2012  . Nausea & vomiting 10/29/2012  . Hypertension 10/29/2012  . Abdominal pain 10/29/2012    Past Surgical History:  Procedure Laterality Date  . ABDOMINAL HYSTERECTOMY     partial  . BREAST SURGERY     reduction  . CESAREAN SECTION    . CHOLECYSTECTOMY      OB History    Gravida Para Term Preterm AB Living   1 1   1   2    SAB TAB Ectopic Multiple Live Births                   Home Medications    Prior to Admission medications   Medication Sig Start Date End Date Taking? Authorizing Provider  amLODipine (NORVASC) 5 MG tablet Take 5 mg by mouth daily.    [provider]  aspirin 325 MG tablet Take 325 mg by mouth daily.    [provider]  esomeprazole (NEXIUM) 40 MG capsule Take 1 capsule (40 mg total) by mouth 2 (two) times daily before a meal. 05/31/15   Regalado, Belkys A, MD  naproxen (NAPROSYN) 500 MG tablet Take 1 tablet (500 mg total) by mouth 2 (two) times daily as needed (pain). 10/18/17   Wojeck, Hinton Dyerobyn K, NP  traMADol (ULTRAM) 50 MG tablet Take 1 tablet (50 mg total) by mouth every 6 (six) hours as needed for moderate pain. 05/31/15   Regalado, Belkys A, MD  triamcinolone cream (KENALOG) 0.1 % Use 4 times a day on rash of the left arm 05/19/17   Mancel BaleWentz, Elliott, MD  valsartan-hydrochlorothiazide (DIOVAN-HCT) 320-25 MG per tablet Take 1 tablet by mouth daily.    [provider]    Family History Family History  Problem Relation Age of Onset  . Heart disease Mother   . Kidney failure Mother   . Hypertension Sister     Social History Social History   Tobacco Use  . Smoking status: Never Smoker  . Smokeless tobacco: Never Used  Substance Use Topics  . Alcohol use: No  .  Drug use: No     Allergies   Compazine [prochlorperazine]; Morphine and related; Penicillins; Tomato; and Zofran [ondansetron hcl]   Review of Systems Review of Systems  Constitutional: Negative for chills and fever.  Musculoskeletal: Positive for arthralgias.  Neurological: Negative for weakness and numbness.  All other systems reviewed and are negative.    Physical Exam Updated Vital Signs BP (!) 202/92 (BP Location: Left Arm)   Pulse 67   Temp 97.9 F (36.6 C) (Oral)   Resp 18   SpO2 100%   Physical Exam  Constitutional: She appears well-developed and well-nourished. No distress.  Eyes: Conjunctivae are normal.  Neck: Neck supple.  Neurological: She is alert.  Minimal swelling to the right wrist and hand.  There is tenderness over anterior forearm and anterior upper arm with no skin discoloration, bruising, erythema.   Distal radial pulses intact.  Full range of motion of the elbow and wrist joints.  Skin: Skin is warm and dry.  Nursing note and vitals reviewed.    ED Treatments / Results  Labs (all labs ordered are listed, but only abnormal results are displayed) Labs Reviewed - No data to display  EKG  EKG Interpretation None       Radiology No results found.  Procedures Procedures (including critical care time)  Medications Ordered in ED Medications - No data to display   Initial Impression / Assessment and Plan / ED Course  I have reviewed the triage vital signs and the nursing notes.  Pertinent labs & imaging results that were available during my care of the patient were reviewed by me and considered in my medical decision making (see chart for details).     The right arm pain and swelling.  Concerned she may have a DVT.  Venous study performed and is negative.  No signs of cellulitis or infectious process.  No injuries.  Patient would like to be discharged home at this time.  She is hypertensive, this was discussed with her, she will follow-up with family doctor for recheck.  She is asymptomatic for her hypertension.  Specifically denies any chest pain or shortness of breath.  No headache.  Return precautions discussed.  Vitals:   03/10/18 1553 03/10/18 1832  BP: (!) 186/110 (!) 202/92  Pulse: 73 67  Resp: 18 18  Temp: 97.9 F (36.6 C)   TempSrc: Oral   SpO2: 100% 100%     Final Clinical Impressions(s) / ED Diagnoses   Final diagnoses:  Right arm pain  Secondary hypertension    ED Discharge Orders    None       Iona Coach 03/10/18 Daiva Eves, MD 03/10/18 2258

## 2018-03-10 NOTE — ED Provider Notes (Signed)
Patient placed in Quick Look pathway, seen and evaluated   Chief Complaint: pain and swelling in right arm   HPI:   Reports pain and swelling in right arm that started 2 days ago. States no injuries.No fever or chills. Reports some weakness, no numbness, still able to use it.  Reports history of multiple DVTs, "years ago" states was on Coumadin but currently not anticoagulated.  ROS: Positive for right arm swelling and pain.  Negative for numbness, fever, chills, redness, wounds  Physical Exam:   Gen: No distress  Neuro: Awake and Alert  Skin: Warm    Focused Exam: Right arm slightly more edematous than left from the forearm to the hand.  There is no erythema.  There is mild tenderness to palpation over anterior forearm and antecubital fossa and anterior upper arm.  Distal radial pulse intact.  5 out of 5 strength of the grip, bicep, tricep.  Sensation is intact.   Patient seen and medically screened in triage.  Patient with right arm swelling for the last 2 days.  History of DVTs, states feels similar.  Currently not anticoagulated.  Will get venous duplex for further evaluation.  Patient is hypertensive, states that she is on blood pressure medicines and she has been taking them.  Asymptomatic otherwise for her blood pressure.  Vitals:   03/10/18 1553  BP: (!) 186/110  Pulse: 73  Resp: 18  Temp: 97.9 F (36.6 C)  TempSrc: Oral  SpO2: 100%      Initiation of care has begun. The patient has been counseled on the process, plan, and necessity for staying for the completion/evaluation, and the remainder of the medical screening examination    Jaynie CrumbleKirichenko, Adeyemi Hamad, Cordelia Poche-C 03/10/18 1606    Azalia Bilisampos, Kevin, MD 03/10/18 2300

## 2018-03-10 NOTE — Discharge Instructions (Signed)
Your venous study is negative for a blood clot. Keep your arm elevated at home. Avoid strenuous use. Your blood pressure high today. Low sodium diet. Make sure you take all your medications. Follow up with family doctor.

## 2018-10-24 ENCOUNTER — Encounter (HOSPITAL_COMMUNITY): Payer: Self-pay | Admitting: Emergency Medicine

## 2018-10-24 ENCOUNTER — Emergency Department (HOSPITAL_COMMUNITY)
Admission: EM | Admit: 2018-10-24 | Discharge: 2018-10-25 | Disposition: A | Discharge status: Home / Self Care | Payer: BLUE CROSS/BLUE SHIELD | Attending: Emergency Medicine | Admitting: Emergency Medicine

## 2018-10-24 DIAGNOSIS — M79604 Pain in right leg: Secondary | ICD-10-CM | POA: Diagnosis present

## 2018-10-24 DIAGNOSIS — I10 Essential (primary) hypertension: Secondary | ICD-10-CM | POA: Diagnosis not present

## 2018-10-24 DIAGNOSIS — R2241 Localized swelling, mass and lump, right lower limb: Secondary | ICD-10-CM | POA: Diagnosis not present

## 2018-10-24 DIAGNOSIS — Z7982 Long term (current) use of aspirin: Secondary | ICD-10-CM | POA: Diagnosis not present

## 2018-10-24 DIAGNOSIS — Z79899 Other long term (current) drug therapy: Secondary | ICD-10-CM | POA: Insufficient documentation

## 2018-10-24 LAB — BASIC METABOLIC PANEL
Anion gap: 6 (ref 5–15)
BUN: 9 mg/dL (ref 6–20)
CO2: 27 mmol/L (ref 22–32)
Calcium: 9.3 mg/dL (ref 8.9–10.3)
Chloride: 106 mmol/L (ref 98–111)
Creatinine, Ser: 1 mg/dL (ref 0.44–1.00)
GFR calc Af Amer: >60 mL/min (ref >60)
GLUCOSE: 119 mg/dL — AB (ref 70–99)
POTASSIUM: 3.5 mmol/L (ref 3.5–5.1)
SODIUM: 139 mmol/L (ref 135–145)

## 2018-10-24 LAB — CBC
HCT: 38.7 % (ref 36.0–46.0)
Hemoglobin: 12.4 g/dL (ref 12.0–15.0)
MCH: 27.2 pg (ref 26.0–34.0)
MCHC: 32 g/dL (ref 30.0–36.0)
MCV: 84.9 fL (ref 80.0–100.0)
NRBC: 0 % (ref 0.0–0.2)
PLATELETS: 339 10*3/uL (ref 150–400)
RBC: 4.56 MIL/uL (ref 3.87–5.11)
RDW: 13.9 % (ref 11.5–15.5)
WBC: 10.4 10*3/uL (ref 4.0–10.5)

## 2018-10-24 NOTE — ED Triage Notes (Signed)
Pt reports R leg swelling, R knee and groin pain since yesterday. Reports hx of DVT and PE, hasn't had a clot in 15 years and takes no blood thinners.

## 2018-10-25 ENCOUNTER — Inpatient Hospital Stay (HOSPITAL_COMMUNITY): Admission: RE | Admit: 2018-10-25 | Payer: BLUE CROSS/BLUE SHIELD | Source: Ambulatory Visit

## 2018-10-25 MED ORDER — ENOXAPARIN SODIUM 150 MG/ML ~~LOC~~ SOLN
150.0000 mg | Freq: Once | SUBCUTANEOUS | Status: AC
  Administered 2018-10-25: 150 mg via SUBCUTANEOUS
  Filled 2018-10-25: qty 1

## 2018-10-25 NOTE — Discharge Instructions (Addendum)
Follow-up with Dr. Evlyn Kanner on Monday.  You should return tomorrow to have an ultrasound of your leg.  Return to the emergency department if you have any chest pain, shortness of breath or other worsening symptoms.

## 2018-10-25 NOTE — ED Provider Notes (Signed)
[facility] EMERGENCY DEPARTMENT Provider Note   CSN: [id number] Arrival date & time: 10/24/18  2210     History   Chief Complaint Chief Complaint  Patient presents with  . Leg Pain    HPI Jasmine Roberson is a 47 y.o. female.  Patient is a 47 year old female with a history of prior DVT and PE who presents with right leg pain and swelling.  She has had several prior blood clots.  Her last DVT was about 10 years ago per her report.  She has a 2-day history of some swelling to her right leg with some pain behind her knee.  She states today she feels like the swelling has increased to her thigh.  She denies any numbness or weakness to the leg.  She denies any chest pain or shortness of breath.  She came in because she is concerned that she may have another blood clot.     Past Medical History:  Diagnosis Date  . Diverticulitis   . DVT (deep venous thrombosis) (HCC)   . GERD (gastroesophageal reflux disease)   . Headache(784.0)   . HTN (hypertension)   . Obesity, Class III, BMI 40-49.9 (morbid obesity) (HCC) 11/01/2012   BMI 48 on 01Nov2013  . PE (pulmonary embolism)     Patient Active Problem List   Diagnosis Date Noted  . Abnormal CT scan of lung 11/14/2016  . Chest pain with low risk for cardiac etiology 05/30/2015  . Epiploic appendagitis 11/01/2012  . Obesity, Class III, BMI 40-49.9 (morbid obesity) (HCC) 11/01/2012  . Nausea & vomiting 10/29/2012  . Hypertension 10/29/2012  . Abdominal pain 10/29/2012    Past Surgical History:  Procedure Laterality Date  . ABDOMINAL HYSTERECTOMY     partial  . BREAST SURGERY     reduction  . CESAREAN SECTION    . CHOLECYSTECTOMY       OB History    Gravida  1   Para  1   Term      Preterm  1   AB      Living  2     SAB      TAB      Ectopic      Multiple      Live Births               Home Medications    Prior to Admission medications   Medication Sig Start Date End Date  Taking? Authorizing Provider  amLODipine (NORVASC) 5 MG tablet Take 5 mg by mouth daily.    [provider]  aspirin 325 MG tablet Take 325 mg by mouth daily.    [provider]  esomeprazole (NEXIUM) 40 MG capsule Take 1 capsule (40 mg total) by mouth 2 (two) times daily before a meal. 05/31/15   Regalado, Belkys A, MD  naproxen (NAPROSYN) 500 MG tablet Take 1 tablet (500 mg total) by mouth 2 (two) times daily as needed (pain). 10/18/17   Wojeck, Hinton Dyer, NP  traMADol (ULTRAM) 50 MG tablet Take 1 tablet (50 mg total) by mouth every 6 (six) hours as needed for moderate pain. 05/31/15   Regalado, Belkys A, MD  triamcinolone cream (KENALOG) 0.1 % Use 4 times a day on rash of the left arm 05/19/17   Mancel Bale, MD  valsartan-hydrochlorothiazide (DIOVAN-HCT) 320-25 MG per tablet Take 1 tablet by mouth daily.    [provider]    Family History Family History  Problem Relation Age of  Onset  . Heart disease Mother   . Kidney failure Mother   . Hypertension Sister     Social History Social History   Tobacco Use  . Smoking status: Never Smoker  . Smokeless tobacco: Never Used  Substance Use Topics  . Alcohol use: No  . Drug use: No     Allergies   Compazine [prochlorperazine]; Morphine and related; Penicillins; Tomato; and Zofran [ondansetron hcl]   Review of Systems Review of Systems  Constitutional: Negative for chills, diaphoresis, fatigue and fever.  HENT: Negative for congestion, rhinorrhea and sneezing.   Eyes: Negative.   Respiratory: Negative for cough, chest tightness and shortness of breath.   Cardiovascular: Positive for leg swelling. Negative for chest pain.  Gastrointestinal: Negative for abdominal pain, blood in stool, diarrhea, nausea and vomiting.  Genitourinary: Negative for difficulty urinating, flank pain, frequency and hematuria.  Musculoskeletal: Positive for myalgias. Negative for arthralgias and back pain.  Skin: Negative for  rash.  Neurological: Negative for dizziness, speech difficulty, weakness, numbness and headaches.     Physical Exam Updated Vital Signs BP (!) 192/92   Pulse 87   Temp 97.8 F (36.6 C) (Oral)   Resp 16   Ht 5\' 7"  (1.702 m)   Wt (!) 154.2 kg   SpO2 100%   BMI 53.25 kg/m   Physical Exam  Constitutional: She is oriented to person, place, and time. She appears well-developed and well-nourished.  HENT:  Head: Normocephalic and atraumatic.  Eyes: Pupils are equal, round, and reactive to light.  Neck: Normal range of motion. Neck supple.  Cardiovascular: Normal rate, regular rhythm and normal heart sounds.  Pulmonary/Chest: Effort normal and breath sounds normal. No respiratory distress. She has no wheezes. She has no rales. She exhibits no tenderness.  Abdominal: Soft. Bowel sounds are normal. There is no tenderness. There is no rebound and no guarding.  Musculoskeletal: Normal range of motion. She exhibits edema.  Patient has large legs bilaterally but the right leg seems to be a little more swollen than the left.  There is some tenderness to the posterior calf and posterior knee.  There is no joint swelling.  No warmth or erythema.  There is some ecchymosis around her ankle but no bony tenderness is noted.  Pedal pulses are intact.  Lymphadenopathy:    She has no cervical adenopathy.  Neurological: She is alert and oriented to person, place, and time.  Skin: Skin is warm and dry. No rash noted.  Psychiatric: She has a normal mood and affect.     ED Treatments / Results  Labs (all labs ordered are listed, but only abnormal results are displayed) Labs Reviewed  BASIC METABOLIC PANEL - Abnormal; Notable for the following components:      Result Value   Glucose, Bld 119 (*)    All other components within normal limits  CBC    EKG None  Radiology No results found.  Procedures Procedures (including critical care time)  Medications Ordered in ED Medications  enoxaparin  (LOVENOX) injection 154 mg (has no administration in time range)     Initial Impression / Assessment and Plan / ED Course  I have reviewed the triage vital signs and the nursing notes.  Pertinent labs & imaging results that were available during my care of the patient were reviewed by me and considered in my medical decision making (see chart for details).     Patient with right leg swelling and concern for DVT.  Labs are reviewed  and are non-concerning.  She has normal kidney function.  She has no suggestions or symptoms of a pulmonary embolus.  She was given a shot of Lovenox and will bring her back in the morning for a Doppler ultrasound of her leg.  Were unable to get that done tonight.  She was encouraged to follow-up with her PCP.  She was given return precautions. Final Clinical Impressions(s) / ED Diagnoses   Final diagnoses:  Right leg pain    ED Discharge Orders         Ordered    LE VENOUS     10/25/18 0320           Rolan Bucco, MD 10/25/18 [phone]

## 2019-01-09 ENCOUNTER — Emergency Department (HOSPITAL_COMMUNITY): Payer: BLUE CROSS/BLUE SHIELD

## 2019-01-09 ENCOUNTER — Emergency Department (HOSPITAL_COMMUNITY)
Admission: EM | Admit: 2019-01-09 | Discharge: 2019-01-10 | Disposition: A | Discharge status: Home / Self Care | Payer: BLUE CROSS/BLUE SHIELD | Attending: Emergency Medicine | Admitting: Emergency Medicine

## 2019-01-09 DIAGNOSIS — Z7982 Long term (current) use of aspirin: Secondary | ICD-10-CM | POA: Diagnosis not present

## 2019-01-09 DIAGNOSIS — Z79899 Other long term (current) drug therapy: Secondary | ICD-10-CM | POA: Diagnosis not present

## 2019-01-09 DIAGNOSIS — I10 Essential (primary) hypertension: Secondary | ICD-10-CM | POA: Diagnosis not present

## 2019-01-09 DIAGNOSIS — R0602 Shortness of breath: Secondary | ICD-10-CM | POA: Diagnosis not present

## 2019-01-09 DIAGNOSIS — I824Z1 Acute embolism and thrombosis of unspecified deep veins of right distal lower extremity: Secondary | ICD-10-CM | POA: Diagnosis not present

## 2019-01-09 LAB — CBC
HCT: 39.7 % (ref 36.0–46.0)
Hemoglobin: 12.9 g/dL (ref 12.0–15.0)
MCH: 27.7 pg (ref 26.0–34.0)
MCHC: 32.5 g/dL (ref 30.0–36.0)
MCV: 85.4 fL (ref 80.0–100.0)
Platelets: 284 10*3/uL (ref 150–400)
RBC: 4.65 MIL/uL (ref 3.87–5.11)
RDW: 13.9 % (ref 11.5–15.5)
WBC: 9.9 10*3/uL (ref 4.0–10.5)
nRBC: 0 % (ref 0.0–0.2)

## 2019-01-09 LAB — I-STAT TROPONIN, ED: Troponin i, poc: 0 ng/mL (ref 0.00–0.08)

## 2019-01-09 LAB — I-STAT BETA HCG BLOOD, ED (MC, WL, AP ONLY): I-stat hCG, quantitative: <5 m[IU]/mL (ref <5)

## 2019-01-09 LAB — BASIC METABOLIC PANEL
Anion gap: 8 (ref 5–15)
BUN: 7 mg/dL (ref 6–20)
CO2: 26 mmol/L (ref 22–32)
Calcium: 9.1 mg/dL (ref 8.9–10.3)
Chloride: 104 mmol/L (ref 98–111)
Creatinine, Ser: 0.97 mg/dL (ref 0.44–1.00)
GFR calc Af Amer: >60 mL/min (ref >60)
GFR calc non Af Amer: >60 mL/min (ref >60)
Glucose, Bld: 98 mg/dL (ref 70–99)
Potassium: 3.7 mmol/L (ref 3.5–5.1)
Sodium: 138 mmol/L (ref 135–145)

## 2019-01-09 MED ORDER — ASPIRIN-ACETAMINOPHEN-CAFFEINE 250-250-65 MG PO TABS
1.0000 | ORAL_TABLET | Freq: Once | ORAL | Status: AC
  Administered 2019-01-09: 1 via ORAL
  Filled 2019-01-09: qty 1

## 2019-01-09 MED ORDER — ENOXAPARIN SODIUM 100 MG/ML ~~LOC~~ SOLN
100.0000 mg | Freq: Once | SUBCUTANEOUS | Status: AC
  Administered 2019-01-10: 100 mg via SUBCUTANEOUS
  Filled 2019-01-09: qty 1

## 2019-01-09 MED ORDER — IOPAMIDOL (ISOVUE-370) INJECTION 76%
100.0000 mL | Freq: Once | INTRAVENOUS | Status: AC | PRN
  Administered 2019-01-09: 100 mL via INTRAVENOUS

## 2019-01-09 NOTE — Discharge Instructions (Signed)
We have given you and you 1 dose of Lovenox for anticoagulation.  It will be important for you to have your ultrasound done of your right lower extremity tomorrow.  If you start to develop any worsening shortness of breath, chest pain, worsening of your symptoms then please do not hesitate to return to the emergency room.

## 2019-01-09 NOTE — ED Notes (Signed)
Discharge reviewed.  Waiting for lovenox to come up from pharmacy.

## 2019-01-09 NOTE — ED Notes (Signed)
Taken to CT.

## 2019-01-09 NOTE — ED Notes (Signed)
Taking to xray at this time.  Attempted X 2 for IV without success.  Someone else to try when patient gets back.

## 2019-01-09 NOTE — ED Triage Notes (Signed)
Pt has hx of PE and DVT. Has been having sob x3 weeks. Only has chest pain when lifting her L arm. Also noted to have a hard area to R lower leg. Reports flying back from [place] on 1/6. Had a dimer drawn on Thursday at doctor's office that was slightly elevated and has a pulmonary function test ordered on January 20th. Today pt reports increased shortness of breath on ambulation. Last treated 8-9 years ago with Coumadin for clots

## 2019-01-09 NOTE — ED Provider Notes (Signed)
[facility] EMERGENCY DEPARTMENT Provider Note   CSN: [id number] Arrival date & time: 01/09/19  1955     History   Chief Complaint Chief Complaint  Patient presents with  . Shortness of Breath    HPI Jasmine Roberson is a 48 y.o. 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  Patient reports that for the past 3 weeks that she has been having some increased shortness of breath.  Reports that in the past she has had a history of PEs and DVTs.  Last week she was seen by her primary care physician who obtained a d-dimer.  Patient reports that d-dimer was elevated.  She reports that for the past 3 days her shortness of breath has increased and today she was unable to perform her duties at work.  Her employer asked that she come into the emergency room due to concern for her.  Patient also reports that she has had some significant swelling in her right lower extremity that also started 3 days ago.  Patient states that saw her leg presented previously with a DVT.    Past Medical History:  Diagnosis Date  . Diverticulitis   . DVT (deep venous thrombosis) (HCC)   . GERD (gastroesophageal reflux disease)   . Headache(784.0)   . HTN (hypertension)   . Obesity, Class III, BMI 40-49.9 (morbid obesity) (HCC) 11/01/2012   BMI 48 on 01Nov2013  . PE (pulmonary embolism)     Patient Active Problem List   Diagnosis Date Noted  . Abnormal CT scan of lung 11/14/2016  . Chest pain with low risk for cardiac etiology 05/30/2015  . Epiploic appendagitis 11/01/2012  . Obesity, Class III, BMI 40-49.9 (morbid obesity) (HCC) 11/01/2012  . Nausea & vomiting 10/29/2012  . Hypertension 10/29/2012  . Abdominal pain 10/29/2012    Past Surgical History:  Procedure Laterality Date  . ABDOMINAL HYSTERECTOMY     partial  . BREAST SURGERY     reduction  . CESAREAN SECTION    . CHOLECYSTECTOMY       OB History    Gravida  1   Para  1     Term      Preterm  1   AB      Living  2     SAB      TAB      Ectopic      Multiple      Live Births               Home Medications    Prior to Admission medications   Medication Sig Start Date End Date Taking? Authorizing Provider  amLODipine (NORVASC) 5 MG tablet Take 5 mg by mouth daily.    [provider]  aspirin 325 MG tablet Take 325 mg by mouth daily.    [provider]  esomeprazole (NEXIUM) 40 MG capsule Take 1 capsule (40 mg total) by mouth 2 (two) times daily before a meal. 05/31/15   Regalado, Belkys A, MD  naproxen (NAPROSYN) 500 MG tablet Take 1 tablet (500 mg total) by mouth 2 (two) times daily as needed (pain). 10/18/17   Wojeck, Hinton Dyer, NP  traMADol (ULTRAM) 50 MG tablet Take 1 tablet (50 mg total) by mouth every 6 (six) hours as needed for moderate pain. 05/31/15   Regalado, Belkys A, MD  triamcinolone cream (KENALOG) 0.1 % Use 4 times a day on rash of the left arm 05/19/17  Mancel BaleWentz, Elliott, MD  valsartan-hydrochlorothiazide (DIOVAN-HCT) 320-25 MG per tablet Take 1 tablet by mouth daily.    [provider]    Family History Family History  Problem Relation Age of Onset  . Heart disease Mother   . Kidney failure Mother   . Hypertension Sister     Social History Social History   Tobacco Use  . Smoking status: Never Smoker  . Smokeless tobacco: Never Used  Substance Use Topics  . Alcohol use: No  . Drug use: No     Allergies   Compazine [prochlorperazine]; Morphine and related; Penicillins; Tomato; and Zofran [ondansetron hcl]   Review of Systems Review of Systems  Constitutional: Negative for chills and fever.  HENT: Positive for rhinorrhea.   Respiratory: Positive for cough and shortness of breath. Negative for chest tightness and wheezing.   Cardiovascular: Positive for leg swelling. Negative for chest pain.  Gastrointestinal: Negative for constipation, diarrhea, nausea and vomiting.  Genitourinary:  Negative for dysuria.  Neurological: Positive for headaches.  All other systems reviewed and are negative.    Physical Exam Updated Vital Signs BP (!) 163/83   Pulse 78   Temp (!) 97.5 F (36.4 C)   Resp 18   SpO2 96%   Physical Exam Vitals signs and nursing note reviewed.  Constitutional:      Appearance: She is well-developed. She is obese.  HENT:     Head: Normocephalic and atraumatic.  Eyes:     Extraocular Movements: Extraocular movements intact.     Pupils: Pupils are equal, round, and reactive to light.  Neck:     Musculoskeletal: Normal range of motion and neck supple.  Cardiovascular:     Rate and Rhythm: Normal rate and regular rhythm.     Pulses: Normal pulses.     Heart sounds: Normal heart sounds.     Comments: Right leg tender on exam Pulmonary:     Effort: Accessory muscle usage present.     Breath sounds: Normal breath sounds. No decreased breath sounds or wheezing.  Musculoskeletal: Normal range of motion.     Right lower leg: Edema present.  Skin:    General: Skin is warm.  Neurological:     General: No focal deficit present.     Mental Status: She is alert and oriented to person, place, and time.  Psychiatric:        Mood and Affect: Mood normal.        Behavior: Behavior normal.     ED Treatments / Results  Labs (all labs ordered are listed, but only abnormal results are displayed) Labs Reviewed  BASIC METABOLIC PANEL  CBC  I-STAT TROPONIN, ED  I-STAT BETA HCG BLOOD, ED ([facility], [facility], [facility] ONLY)    EKG EKG Interpretation  Date/Time:  Friday January 09 2019 20:08:12 EST Ventricular Rate:  79 PR Interval:  186 QRS Duration: 94 QT Interval:  370 QTC Calculation: 424 R Axis:   -3 Text Interpretation:  Normal sinus rhythm Normal ECG No significant change since last tracing Confirmed by Richardean CanalYao, David H [phone]([id number]) on 01/09/2019 8:42:23 PM   Radiology Dg Chest 2 View  Result Date: 01/09/2019 CLINICAL DATA:  Dyspnea x3 weeks with right leg swelling  x3 days. EXAM: CHEST - 2 VIEW COMPARISON:  05/30/2015 CXR and CT FINDINGS: Stable cardiomegaly. Nonaneurysmal thoracic aorta. Clear lungs. No acute osseous abnormality. IMPRESSION: No active cardiopulmonary disease. Electronically Signed   By: Tollie Ethavid  Kwon M.D.   On: 01/09/2019 21:28   Ct Angio  Chest Pe W And/or Wo Contrast  Result Date: 01/09/2019 CLINICAL DATA:  History of pulmonary embolus and DVT. Now with shortness of breath for 3 weeks. Chest pain on the left. Air travel from [place] on 01/05/2019. EXAM: CT ANGIOGRAPHY CHEST WITH CONTRAST TECHNIQUE: Multidetector CT imaging of the chest was performed using the standard protocol during bolus administration of intravenous contrast. Multiplanar CT image reconstructions and MIPs were obtained to evaluate the vascular anatomy. CONTRAST:  ISOVUE-370 IOPAMIDOL (ISOVUE-370) INJECTION 76% COMPARISON:  05/30/2015 FINDINGS: Cardiovascular: Poor contrast bolus limits examination. There is moderately good opacification of the central and proximal segmental pulmonary arteries. Visualized pulmonary arteries demonstrate no filling defects suggesting no large central pulmonary embolus. Normal heart size. No pericardial effusion. Normal caliber thoracic aorta. Great vessel origins are patent. Mediastinum/Nodes: Esophagus is decompressed. No significant lymphadenopathy. Lungs/Pleura: No airspace disease or consolidation noted in the lungs. No pleural effusions. No pneumothorax. Airways are patent. Upper Abdomen: No acute abnormalities identified on limited views. Surgical absence of the gallbladder. Musculoskeletal: Degenerative changes in the spine. Review of the MIP images confirms the above findings. IMPRESSION: Technically limited examination but no large central pulmonary emboli are demonstrated. No evidence of active pulmonary disease. Electronically Signed   By: Burman Nieves M.D.   On: 01/09/2019 22:49    Procedures Procedures (including critical care  time)  Medications Ordered in ED Medications  enoxaparin (LOVENOX) injection 100 mg (has no administration in time range)  iopamidol (ISOVUE-370) 76 % injection 100 mL (100 mLs Intravenous Contrast Given 01/09/19 2233)  aspirin-acetaminophen-caffeine (EXCEDRIN MIGRAINE) per tablet 1 tablet (1 tablet Oral Given 01/09/19 2319)     Initial Impression / Assessment and Plan / ED Course  I have reviewed the triage vital signs and the nursing notes.  Pertinent labs & imaging results that were available during my care of the patient were reviewed by me and considered in my medical decision making (see chart for details).   48 year old female here with shortness of breath and left lower extremity edema.  Patient reports that she has had 3 days of worsening shortness of breath on exertion.  Denies any chest pain.  Does have history of PE and DVTs no longer on Coumadin as her last clot was 8 to 9 years ago.  Will obtain CTA chest to rule out PE.  BMP and CBC grossly normal.  DG chest with no signs of acute pulmonary disease.  CTA chest with no signs of PE.  Patient arrived after 7 PM so unable to obtain Doppler of left lower extremity.  However will anticoagulate with 100 mg of Lovenox (2/2 weight of patient) given that patient has very strong history of DVTs as well as PEs.  Will order left lower extremity Doppler to obtain outpatient for tomorrow morning and patient voiced understanding. Given strict return precautions.   [place] Ahmir Bracken, DO PGY-2, [facility]   Final Clinical Impressions(s) / ED Diagnoses   Final diagnoses:  SOB (shortness of breath)  Acute deep vein thrombosis (DVT) of distal vein of right lower extremity [facility])    ED Discharge Orders         Ordered    LE VENOUS     01/09/19 2328           Kendrix Orman, [place], DO 01/09/19 2336    Charlynne Pander, MD 01/12/19 [phone]

## 2019-01-10 ENCOUNTER — Ambulatory Visit (HOSPITAL_COMMUNITY): Admission: RE | Admit: 2019-01-10 | Payer: BLUE CROSS/BLUE SHIELD | Source: Ambulatory Visit

## 2019-01-13 ENCOUNTER — Ambulatory Visit: Payer: BLUE CROSS/BLUE SHIELD | Admitting: Pulmonary Disease

## 2019-02-17 ENCOUNTER — Other Ambulatory Visit: Payer: Self-pay | Admitting: Obstetrics and Gynecology

## 2019-02-17 DIAGNOSIS — R928 Other abnormal and inconclusive findings on diagnostic imaging of breast: Secondary | ICD-10-CM

## 2019-02-20 ENCOUNTER — Other Ambulatory Visit: Payer: BLUE CROSS/BLUE SHIELD

## 2019-03-02 ENCOUNTER — Other Ambulatory Visit: Payer: BLUE CROSS/BLUE SHIELD

## 2019-03-11 ENCOUNTER — Other Ambulatory Visit: Payer: Self-pay

## 2019-03-11 ENCOUNTER — Ambulatory Visit: Payer: BLUE CROSS/BLUE SHIELD

## 2019-03-11 ENCOUNTER — Ambulatory Visit
Admission: RE | Admit: 2019-03-11 | Discharge: 2019-03-11 | Disposition: A | Discharge status: Home / Self Care | Payer: BLUE CROSS/BLUE SHIELD | Source: Ambulatory Visit | Attending: Obstetrics and Gynecology | Admitting: Obstetrics and Gynecology

## 2019-03-11 DIAGNOSIS — R928 Other abnormal and inconclusive findings on diagnostic imaging of breast: Secondary | ICD-10-CM

## 2020-09-18 IMAGING — MG DIGITAL DIAGNOSTIC UNILATERAL RIGHT MAMMOGRAM WITH TOMO AND CAD
4 series · 4 of 12 positions shown · non-contrast
Comparison: Previous exam(s).

CLINICAL DATA: Right lateral breast asymmetry seen on baseline
screening mammography.History of bilateral breast reduction
mammoplasty.

EXAM:
DIGITAL DIAGNOSTIC UNILATERAL RIGHT MAMMOGRAM WITH CAD AND TOMO

[R ML synth-2D]
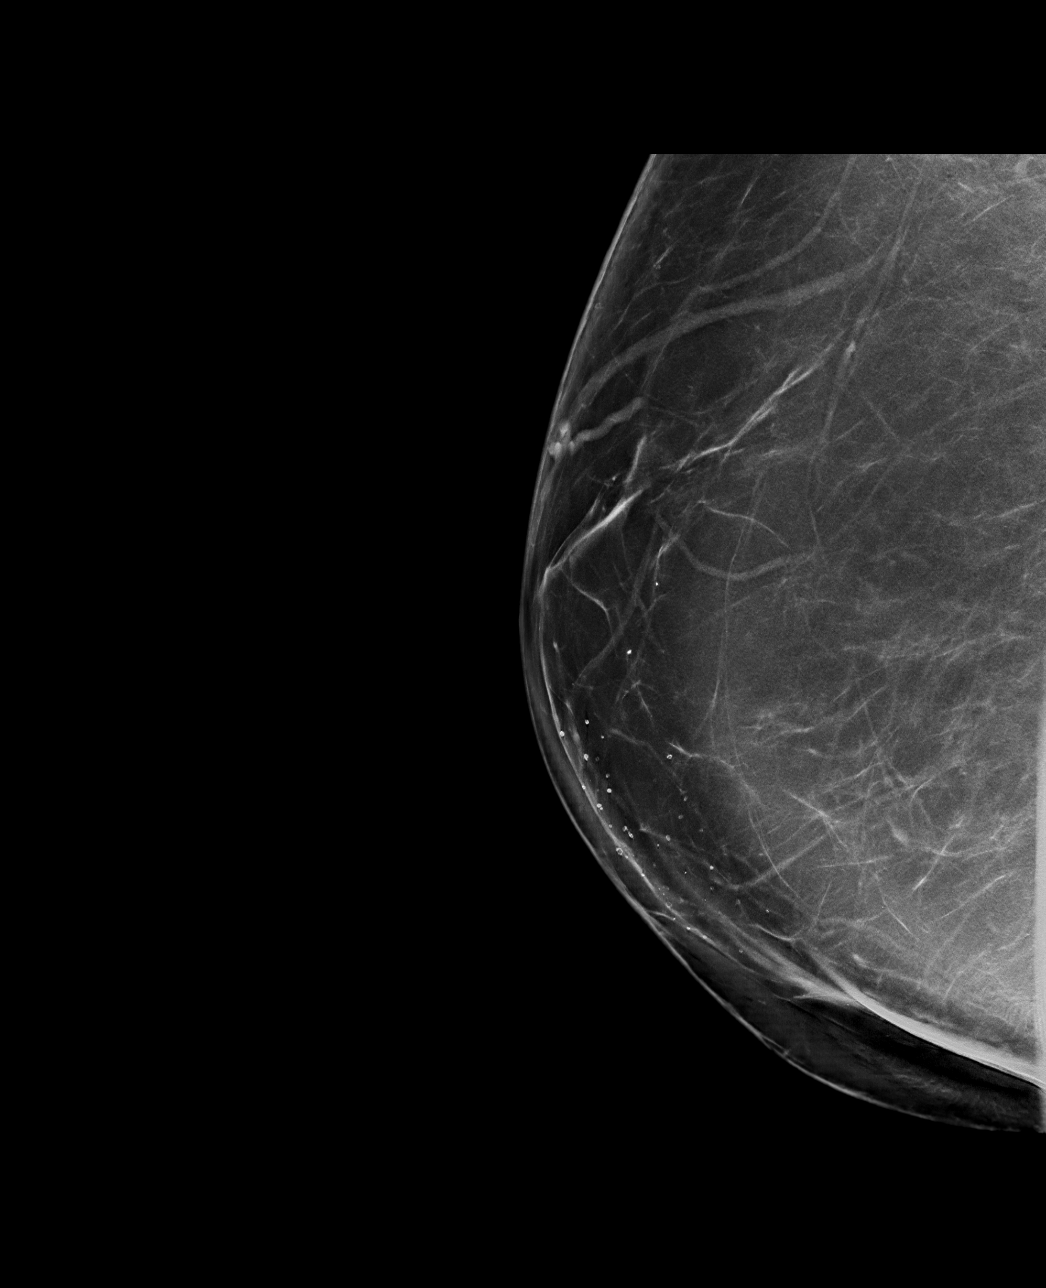

[R CC synth-2D]
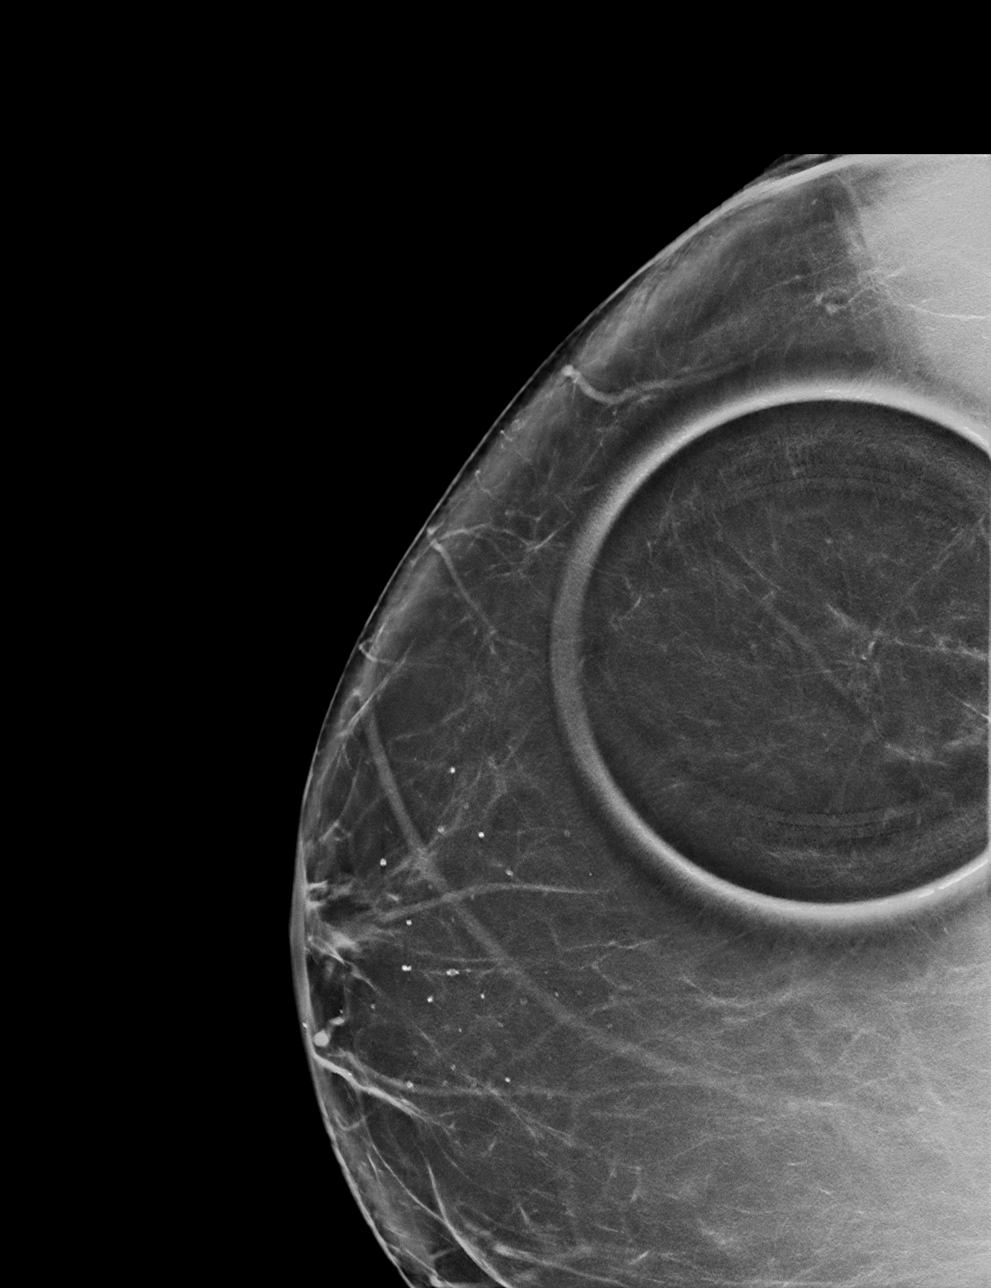

[R CC tomo · tomo slice 49/96.0]
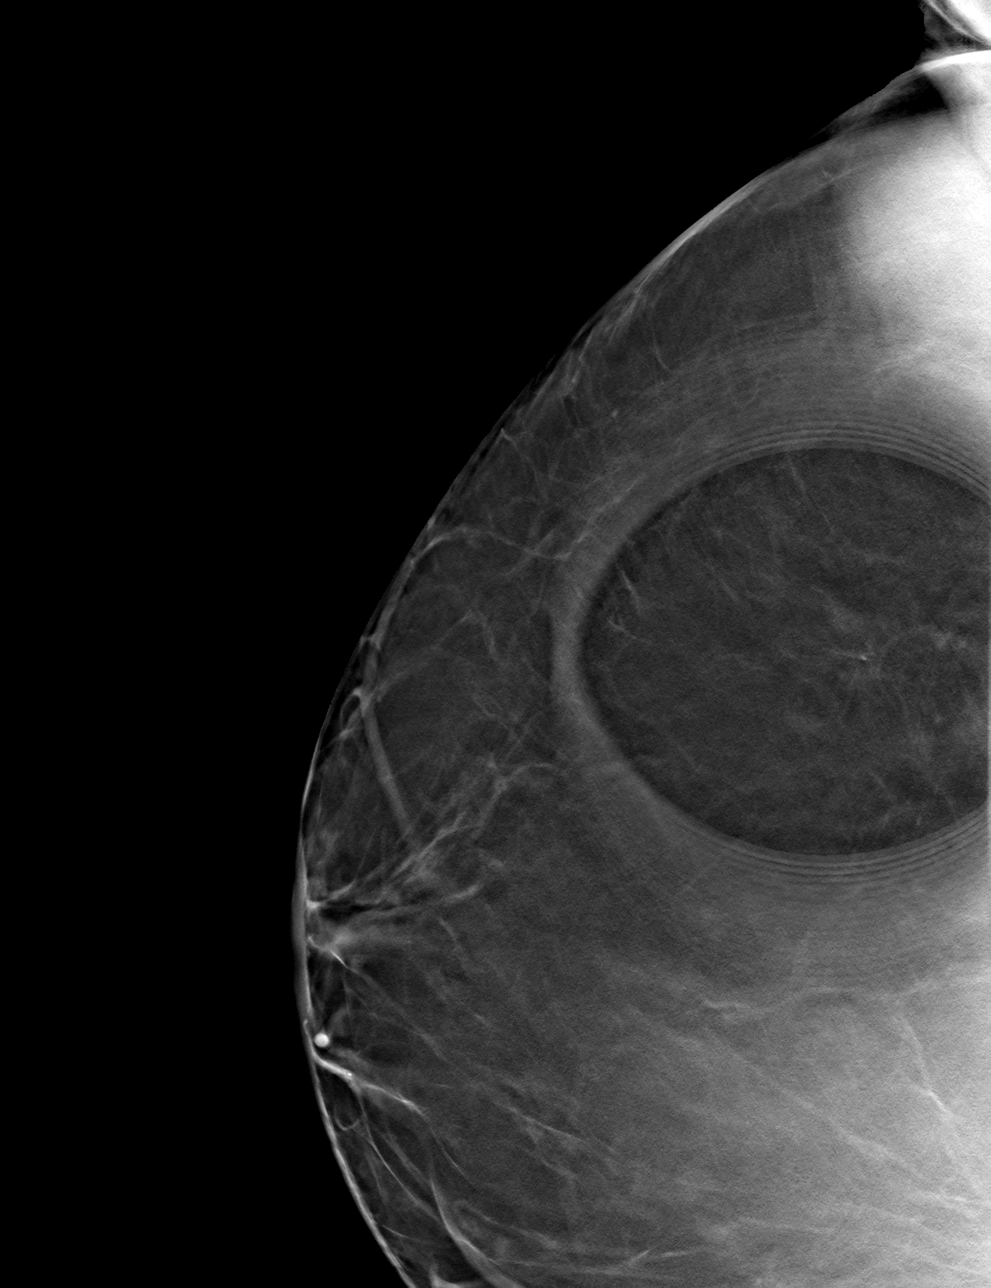

[R ML tomo · tomo slice 51/102.0]
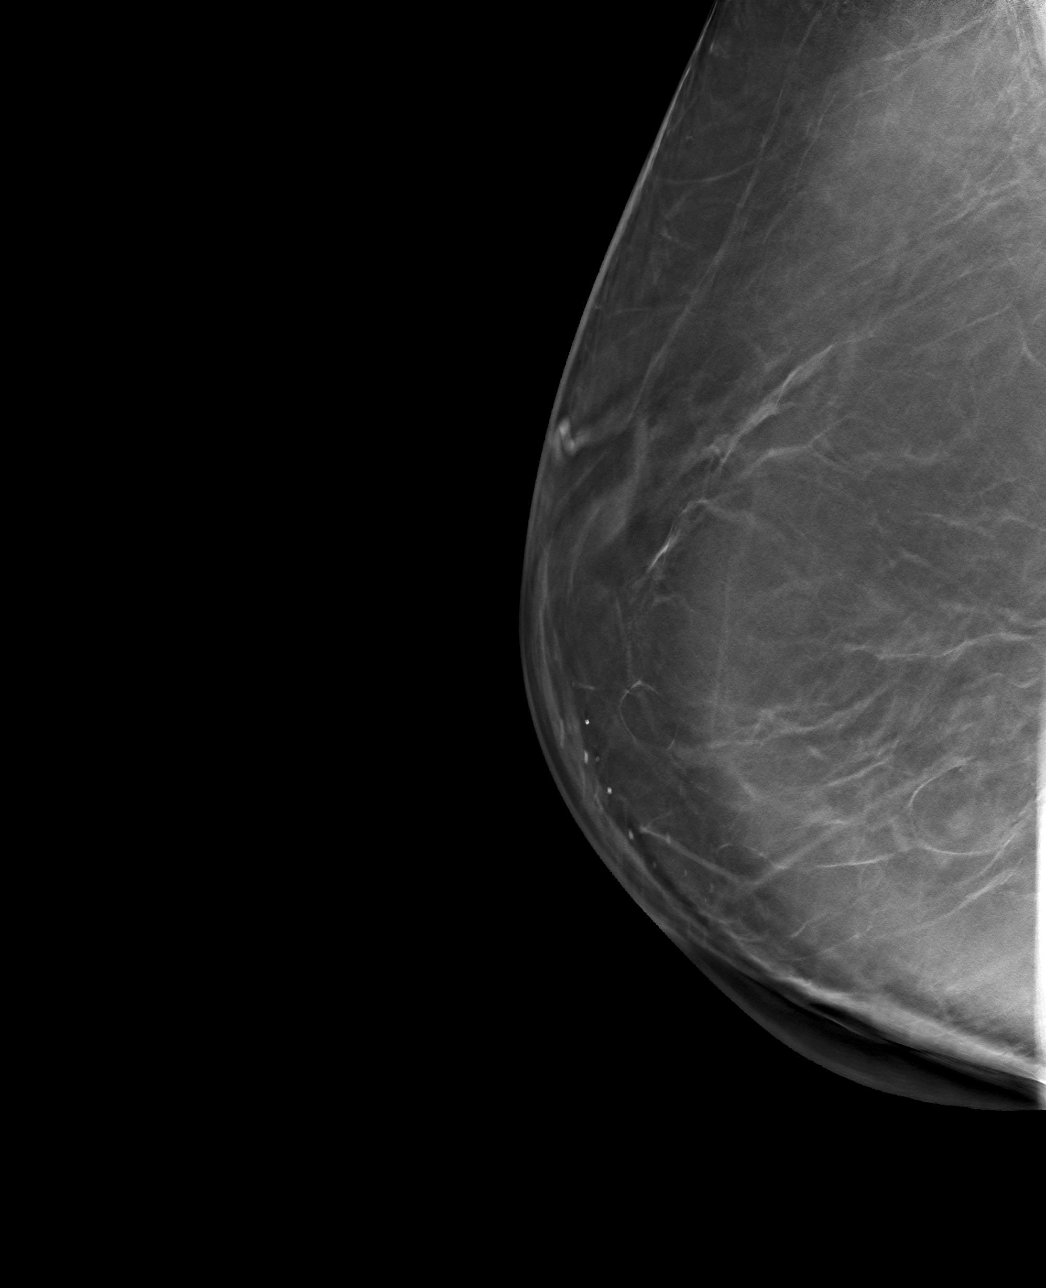

[4 of 12 positions shown; findings below may reference images not displayed]

ACR Breast Density Category b: There are scattered areas of
fibroglandular density.
FINDINGS: Additional mammographic views of the right breast demonstrate no
suspicious masses or areas of architectural distortion. The
questioned area of asymmetry in the lateral right breast corresponds
to surgical scarring.

Mammographic images were processed with CAD.
IMPRESSION: No mammographic evidence of malignancy in the right breast.

RECOMMENDATION:
Screening mammogram in one year.(Code:[id number])

I have discussed the findings and recommendations with the patient.
Results were also provided in writing at the conclusion of the
visit. If applicable, a reminder letter will be sent to the patient
regarding the next appointment.

BI-RADS CATEGORY  2: Benign.
